# Patient Record
Sex: Female | Born: 1975 | Race: White | Hispanic: No | Marital: Single | Smoking: Never smoker
Health system: Southern US, Community
[De-identification: ages and names within clinical notes are randomized; demographics above are authoritative.]

## PROBLEM LIST (undated history)

## (undated) DIAGNOSIS — M170 Bilateral primary osteoarthritis of knee: Secondary | ICD-10-CM

## (undated) DIAGNOSIS — R609 Edema, unspecified: Secondary | ICD-10-CM

## (undated) HISTORY — DX: Bilateral primary osteoarthritis of knee: M17.0

## (undated) HISTORY — DX: Edema, unspecified: R60.9

## (undated) HISTORY — DX: Morbid (severe) obesity due to excess calories: E66.01

---

## 2004-07-04 ENCOUNTER — Ambulatory Visit: Payer: Self-pay | Admitting: Internal Medicine

## 2004-07-12 ENCOUNTER — Ambulatory Visit: Payer: Self-pay | Admitting: Internal Medicine

## 2004-08-19 ENCOUNTER — Emergency Department: Payer: Self-pay | Admitting: Emergency Medicine

## 2005-05-29 ENCOUNTER — Emergency Department: Payer: Self-pay | Admitting: Emergency Medicine

## 2005-11-28 ENCOUNTER — Ambulatory Visit: Payer: Self-pay | Admitting: Obstetrics & Gynecology

## 2006-04-24 ENCOUNTER — Ambulatory Visit: Payer: Self-pay | Admitting: Family Medicine

## 2006-04-24 ENCOUNTER — Ambulatory Visit: Payer: Self-pay | Admitting: Obstetrics & Gynecology

## 2006-04-24 ENCOUNTER — Encounter: Payer: Self-pay | Admitting: Obstetrics & Gynecology

## 2006-10-27 ENCOUNTER — Ambulatory Visit: Payer: Self-pay | Admitting: Gynecology

## 2007-08-19 ENCOUNTER — Ambulatory Visit: Payer: Self-pay | Admitting: Gynecology

## 2007-10-27 ENCOUNTER — Ambulatory Visit: Payer: Self-pay | Admitting: Obstetrics and Gynecology

## 2007-11-10 ENCOUNTER — Ambulatory Visit (HOSPITAL_COMMUNITY): Admission: RE | Admit: 2007-11-10 | Discharge: 2007-11-10 | Payer: Self-pay | Admitting: Obstetrics & Gynecology

## 2008-06-17 ENCOUNTER — Ambulatory Visit: Payer: Self-pay | Admitting: Obstetrics & Gynecology

## 2008-06-20 ENCOUNTER — Inpatient Hospital Stay: Payer: Self-pay

## 2008-06-25 ENCOUNTER — Emergency Department: Payer: Self-pay | Admitting: Emergency Medicine

## 2010-08-14 NOTE — Assessment & Plan Note (Signed)
NAMEDALANEY, NEEDLE NO.:  [id number]   MEDICAL RECORD NO.:  [id number]          PATIENT TYPE:  POB   LOCATION:  [facility] at [facility]         FACILITY:  [facility]   PHYSICIAN:  Ginger Carne, MD DATE OF BIRTH:  1975/05/23   DATE OF SERVICE:  08/19/2007                                  CLINIC NOTE   HISTORY OF PRESENT ILLNESS:  Ms. Zehner returns today because of  noticing her menses are lasting about one week instead of the usual 3-4  days since placement of her Mirena intrauterine device in July 2008.  She desires to have this removed, and the IUD was removed today without  difficulty.  We talked about other options of birth control, but does  not want to use the pill due to weight gain.  She has complained of some  pelvic pain in the last 1-2 days.  This is midcycle for the patient and  pelvic exam demonstrates uterus to be normal size without tenderness  above the adnexa without tenderness or masses.  I explained to the  patient this may well be due to ovulatory discomfort, but if the pain  does not improve by next week, will proceed with an ultrasound, further  evaluation.  She has no GU symptomatology.           ______________________________  Ginger Carne, MD     SHB/MEDQ  D:  08/19/2007  T:  08/19/2007  Job:  [id number]

## 2010-08-14 NOTE — Assessment & Plan Note (Signed)
[name], Mason NO.:  [id number]   MEDICAL RECORD NO.:  [id number]          PATIENT TYPE:  POB   LOCATION:  [facility] at [facility]         FACILITY:  [facility]   PHYSICIAN:  Elsie Lincoln, MD      DATE OF BIRTH:  [birth year]   DATE OF SERVICE:                                  CLINIC NOTE   HISTORY OF PRESENT ILLNESS:  The patient is a 35 year old G29, P17-0-2-2  female who presents for her yearly exam.  She is an outpatient of Dr.  Mia Creek.  The patient is not really sexually active.  Her last  boyfriend was 6 months.  She is __________  contraception and does not  like anything else at this time due to her infrequency of sexual  intercourse.  She has regular periods and does not have problems with  them.   PAST MEDICAL HISTORY:  Denies all problems.   PAST SURGICAL HISTORY:  Denies all surgeries.   PAST GYN HISTORY:  Last Pap smear in 2006.  She has had a LEEP in the  past.  She has never had a mammogram or a breast biopsy.  Never had any  ovarian cysts, fibroid tumors or sexually transmitted disease.   OB HISTORY:  Two vaginal deliveries.  2 miscarriages.   FAMILY HISTORY:  Grandfather had a heart attack.  Dad has high blood  pressure.  Grandmother had breast cancer in her early 70s.   REVIEW OF SYSTEMS:  She bruises her legs sometimes being clumsy at work.  Very rare numbness and weakness in the fingers.  She trips a lot at work  and her legs swell from this.  She has muscle aches in her neck and  back.  Just recently has fatigue but her TSH has been normal for many  years.  She has gained 10 lb.  She has frequent sinus headaches.  She  has dry eyes and astigmatism.  She has mild chest pain occasionally.  She loses urine when she coughs or sneezes.   PHYSICAL EXAMINATION:  VITAL SIGNS:  Pulse 68, blood pressure 130/79,  weight 196.  GENERAL:  Well developed, well nourished, no acute distress.  THYROID:  No masses.  LUNGS:  Clear to auscultation  bilaterally.  HEART:  Regular rate and rhythm.  HEENT:  Normocephalic, atraumatic.  ABDOMEN:  Soft, nontender, no organomegaly, no hernia.  GENITALIA:  Tanner 5.  Vagina pink.  Normal rugae.  Cervix closed,  nontender.  Adnexa difficult to palpate but nontender.  Perineum intact.  Urethra nontender.  No cystocele, no rectocele.   ASSESSMENT/PLAN:  39. A 35 year old female __________  exam room 1.  Pap smear and      culture.  2. Addressing chest pain with primary care Sissy Goetzke.  3. She would like different birth control.  Please come see [place].  4. Return to clinic in a year.  5. Baseline mammogram at 33 and secondary to grandmother's history of      breast cancer.  6. Cholesterol panel.           ______________________________  Elsie Lincoln, MD     KL/MEDQ  D:  04/24/2006  T:  04/24/2006  Job:  [id number]

## 2011-05-20 ENCOUNTER — Inpatient Hospital Stay: Payer: Self-pay | Admitting: Student

## 2011-05-20 LAB — URINALYSIS, COMPLETE
Glucose,UR: NEGATIVE mg/dL (ref 0–75)
Protein: 30
Specific Gravity: 1.027 (ref 1.003–1.030)
WBC UR: 8 /HPF (ref 0–5)

## 2011-05-20 LAB — COMPREHENSIVE METABOLIC PANEL
Anion Gap: 13 (ref 7–16)
Calcium, Total: 8.5 mg/dL (ref 8.5–10.1)
Chloride: 105 mmol/L (ref 98–107)
EGFR (African American): >60
EGFR (Non-African Amer.): >60
Osmolality: 282 (ref 275–301)
Potassium: 3.4 mmol/L — ABNORMAL LOW (ref 3.5–5.1)
SGOT(AST): >2000 U/L — ABNORMAL HIGH (ref 15–37)

## 2011-05-20 LAB — CBC
HCT: 45.1 % (ref 35.0–47.0)
HGB: 15.2 g/dL (ref 12.0–16.0)
Platelet: 238 10*3/uL (ref 150–440)
WBC: 8 10*3/uL (ref 3.6–11.0)

## 2011-05-20 LAB — PROTIME-INR
INR: 1.5
Prothrombin Time: 18.6 secs — ABNORMAL HIGH (ref 11.5–14.7)

## 2011-05-20 LAB — LIPASE, BLOOD: Lipase: 312 U/L (ref 73–393)

## 2011-05-20 LAB — ACETAMINOPHEN LEVEL: Acetaminophen: <2 ug/mL

## 2011-05-20 LAB — HCG, QUANTITATIVE, PREGNANCY: Beta Hcg, Quant.: <1 m[IU]/mL — ABNORMAL LOW

## 2011-05-21 LAB — COMPREHENSIVE METABOLIC PANEL
Albumin: 2.7 g/dL — ABNORMAL LOW (ref 3.4–5.0)
Anion Gap: 7 (ref 7–16)
BUN: 7 mg/dL (ref 7–18)
Calcium, Total: 7.8 mg/dL — ABNORMAL LOW (ref 8.5–10.1)
Chloride: 110 mmol/L — ABNORMAL HIGH (ref 98–107)
Co2: 24 mmol/L (ref 21–32)
EGFR (African American): >60
EGFR (Non-African Amer.): >60
Osmolality: 278 (ref 275–301)
Potassium: 3.5 mmol/L (ref 3.5–5.1)
SGOT(AST): 1527 U/L — ABNORMAL HIGH (ref 15–37)
Sodium: 141 mmol/L (ref 136–145)

## 2011-05-21 LAB — CBC WITH DIFFERENTIAL/PLATELET
Basophil %: 1.2 %
Eosinophil %: 4.3 %
HGB: 13.1 g/dL (ref 12.0–16.0)
Lymphocyte %: 33.7 %
Monocyte %: 11.1 %
Neutrophil %: 49.7 %
RBC: 4.1 10*6/uL (ref 3.80–5.20)

## 2011-05-21 LAB — MAGNESIUM: Magnesium: 1.6 mg/dL — ABNORMAL LOW

## 2011-05-21 LAB — IRON AND TIBC: Iron Bind.Cap.(Total): 368 ug/dL (ref 250–450)

## 2011-05-22 LAB — PROT IMMUNOELECTROPHORES(ARMC)

## 2011-11-19 ENCOUNTER — Ambulatory Visit: Payer: Self-pay | Admitting: Endocrinology

## 2011-11-21 ENCOUNTER — Ambulatory Visit: Payer: Self-pay | Admitting: Endocrinology

## 2012-10-05 ENCOUNTER — Encounter: Payer: Self-pay | Admitting: Family Medicine

## 2012-10-05 DIAGNOSIS — Z01419 Encounter for gynecological examination (general) (routine) without abnormal findings: Secondary | ICD-10-CM

## 2012-12-14 ENCOUNTER — Encounter: Payer: Self-pay | Admitting: Obstetrics & Gynecology

## 2012-12-14 ENCOUNTER — Ambulatory Visit (INDEPENDENT_AMBULATORY_CARE_PROVIDER_SITE_OTHER): Payer: No Typology Code available for payment source | Admitting: Obstetrics & Gynecology

## 2012-12-14 VITALS — BP 112/79 | HR 74 | Ht 64.0 in | Wt 245.0 lb

## 2012-12-14 DIAGNOSIS — Z124 Encounter for screening for malignant neoplasm of cervix: Secondary | ICD-10-CM

## 2012-12-14 DIAGNOSIS — Z0000 Encounter for general adult medical examination without abnormal findings: Secondary | ICD-10-CM

## 2012-12-14 DIAGNOSIS — Z1151 Encounter for screening for human papillomavirus (HPV): Secondary | ICD-10-CM

## 2012-12-14 DIAGNOSIS — Z01419 Encounter for gynecological examination (general) (routine) without abnormal findings: Secondary | ICD-10-CM

## 2012-12-14 NOTE — Progress Notes (Signed)
Subjective:    Joy Mason is a 37 y.o. SW P3 (16, 50, 37 yo)  female who presents for an annual exam. The patient has no complaints today.  The patient is not currently sexually active. GYN screening history: last pap: was normal. The patient wears seatbelts: yes. The patient participates in regular exercise: no. Has the patient ever been transfused or tattooed?: yes. (tattoos) The patient reports that there is not domestic violence in her life.   Menstrual History: OB History   Grav Para Term Preterm Abortions TAB SAB Ect Mult Living   3 3 3  0 0 0 0 0 0 1      Menarche age: 80 [place]: 75  Patient's last menstrual period was 11/30/2012.    The following portions of the patient's history were reviewed and updated as appropriate: allergies, current medications, past family history, past medical history, past social history, past surgical history and problem list.  Review of Systems A comprehensive review of systems was negative.  She has been abstinent for 6 months, used condoms in the past. 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 Declines flu vaccine. Her FP is Dr. Redge Gainer. She had a mammogram about 1 year ago. She needs another one. Her periods are 4-5 days per month, getting heavier.   Objective:    BP 112/79  Pulse 74  Ht 5\' 4"  (1.626 m)  Wt 245 lb (111.131 kg)  BMI 42.03 kg/m2  LMP 11/30/2012  General Appearance:    Alert, cooperative, no distress, appears stated age  Head:    Normocephalic, without obvious abnormality, atraumatic  Eyes:    PERRL, conjunctiva/corneas clear, EOM's intact, fundi    benign, both eyes  Ears:    Normal TM's and external ear canals, both ears  Nose:   Nares normal, septum midline, mucosa normal, no drainage    or sinus tenderness  Throat:   Lips, mucosa, and tongue normal; teeth and gums normal  Neck:   Supple, symmetrical, trachea midline, no adenopathy;    thyroid:  no enlargement/tenderness/nodules; no carotid   bruit or JVD  Back:      Symmetric, no curvature, ROM normal, no CVA tenderness  Lungs:     Clear to auscultation bilaterally, respirations unlabored  Chest Wall:    No tenderness or deformity   Heart:    Regular rate and rhythm, S1 and S2 normal, no murmur, rub   or gallop  Breast Exam:    No tenderness, masses, or nipple abnormality  Abdomen:     Soft, non-tender, bowel sounds active all four quadrants,    no masses, no organomegaly  Genitalia:    Normal female without lesion, discharge or tenderness, NSSA, NT, mobile, normal adnexal exam     Extremities:   Extremities normal, atraumatic, no cyanosis or edema  Pulses:   2+ and symmetric all extremities  Skin:   Skin color, texture, turgor normal, no rashes or lesions  Lymph nodes:   Cervical, supraclavicular, and axillary nodes normal  Neurologic:   CNII-XII intact, normal strength, sensation and reflexes    throughout  .    Assessment:    Healthy female exam.    Plan:     Breast self exam technique reviewed and patient encouraged to perform self-exam monthly. Thin prep Pap smear. with cotesting Encouraged weight loss She declines a flu vaccine

## 2012-12-14 NOTE — Patient Instructions (Signed)

## 2013-02-09 ENCOUNTER — Ambulatory Visit: Payer: Self-pay | Admitting: Obstetrics & Gynecology

## 2014-01-31 ENCOUNTER — Encounter: Payer: Self-pay | Admitting: Obstetrics & Gynecology

## 2014-06-03 ENCOUNTER — Encounter: Payer: Self-pay | Admitting: Obstetrics & Gynecology

## 2014-06-03 ENCOUNTER — Ambulatory Visit (INDEPENDENT_AMBULATORY_CARE_PROVIDER_SITE_OTHER): Payer: No Typology Code available for payment source | Admitting: Obstetrics & Gynecology

## 2014-06-03 VITALS — BP 111/83 | HR 66 | Ht 64.0 in | Wt 238.4 lb

## 2014-06-03 DIAGNOSIS — Z01419 Encounter for gynecological examination (general) (routine) without abnormal findings: Secondary | ICD-10-CM

## 2014-06-03 DIAGNOSIS — Z803 Family history of malignant neoplasm of breast: Secondary | ICD-10-CM | POA: Insufficient documentation

## 2014-06-03 DIAGNOSIS — Z1231 Encounter for screening mammogram for malignant neoplasm of breast: Secondary | ICD-10-CM

## 2014-06-03 DIAGNOSIS — Z1151 Encounter for screening for human papillomavirus (HPV): Secondary | ICD-10-CM

## 2014-06-03 DIAGNOSIS — Z124 Encounter for screening for malignant neoplasm of cervix: Secondary | ICD-10-CM

## 2014-06-03 NOTE — Progress Notes (Signed)
Patient would like to have a mammogram ordered.  She also wanted to ask if you prescribed phentermine, she walks every day and has changed her eating habits, she lost 20 pounds and hit a plateau.  Last pap was September 2014 and was normal with negative HPV.

## 2014-06-03 NOTE — Patient Instructions (Addendum)
RE: MyChart  Dear Ms. Durney  We are excited to introduce MyChart, a new best-in-class service that provides you online access to important information in your electronic medical record. We want to make it easier for you to view your health information - all in one secure location - when and where you need it. We expect MyChart will enhance the quality of care and service we provide. Use the activation code below to enroll in MyChart online at [email or web address]  When you register for MyChart, you can:  Marland Kitchen View your test results. . Communicate securely with your physician's office.  . View your medical history, allergies, medications, and immunizations. . Conveniently print information such as your medication lists.  If you are age 12 or older and want a member of your family to have access to your record, you must provide written consent by completing a proxy form available at our facility. Please speak to our clinical staff about guidelines regarding accounts for patients younger than age 42.  As you activate your MyChart account and need any technical assistance, please call the MyChart technical support line at [phone]) or email your question to [email or web address]. If you email your question(s), please include your name, a return phone number and the best time to reach you.  Thank you for using MyChart as your new health and wellness resource!  MyChart Activation Code:  [email or web address] Expires: 08/02/2014 10:21 AM       BRCA-1 and BRCA-2 BRCA-1 and BRCA-2 are 2 genes that are linked with hereditary breast and ovarian cancers. About 200,000 women are diagnosed with invasive breast cancer each year and about 23,000 with ovarian cancer (according to the [place]). Of these cancers, about 5% to 10% will be due to a mutation in one of the BRCA genes. Men can also inherit an increased risk of developing breast cancer, primarily from an  alteration in the BRCA-2 gene.  Individuals with mutations in BRCA1 or BRCA2 have significantly elevated risks for breast cancer (up to 80% lifetime risk), ovarian cancer (up to 40% lifetime risk), bilateral breast cancer and other types of cancers. BRCA mutations are inherited and passed from generation to generation. One half of the time, they are passed from the father's side of the family.  The DNA in white blood cells is used to detect mutations in the BRCA genes. While the gene products (proteins) of the BRCA genes act only in breast and ovarian tissue, the genes are present in every cell of the body and blood is the most easily accessible source of that DNA. PREPARATION FOR TEST The test for BRCA mutations is done on a blood sample collected by needle from a vein in the arm. The test does not require surgical biopsy of breast or ovarian tissue.  NORMAL FINDINGS No genetic mutations. Ranges for normal findings may vary among different laboratories and hospitals. You should always check with your doctor after having lab work or other tests done to discuss the meaning of your test results and whether your values are considered within normal limits. MEANING OF TEST  Your caregiver will go over the test results with you and discuss the importance and meaning of your results, as well as treatment options and the need for additional tests if necessary. OBTAINING THE TEST RESULTS It is your responsibility to obtain your test results. Ask the lab or department performing the test when and how you will get your results. OTHER THINGS TO KNOW Your test results may  have implications for other family members. When one member of a family is tested for BRCA mutations, issues often arise about how or whether to share this information with other family members. Seek advice from a genetic counselor about communication of result with your family members.  Pre and post test consultation with a health care provider  knowledgeable about genetic testing cannot be overemphasized.  There are many issues to be considered when preparing for a genetic test and upon learning the results, and a genetic counselor has the knowledge and experience to help you sort through them.  If the BRCA test is positive, the options include increased frequency of check-ups (e.g., mammography, blood tests for CA-125, or transvaginal ultrasonography); medications that could reduce risk (e.g., oral contraceptives or tamoxifen); or surgical removal of the ovaries or breasts. There are a number of variables involved and it is important to discuss your options with your doctor and genetic counselor. Research studies have reported that for every 1000 women negative for BRCA mutations, between 12 and 51 of them will develop breast cancer by age 78 and between 40 and 61 will develop ovarian cancer by age 56. The risk increases with age. The test can be ordered by a doctor, preferably by one who can also offer genetic counseling. The blood sample will be sent to a laboratory that specializes in BRCA testing. The American Society of Clinical Oncology and the [place] encourage women seeking the test to participate in long-term outcome studies to help gather information on the effectiveness of different check-up and treatment options. Document Released: 04/11/2004 Document Revised: 06/10/2011 Document Reviewed: 06/18/2013 [facility] Patient Information 2015 [place], [place]. This information is not intended to replace advice given to you by your health care provider. Make sure you discuss any questions you have with your health care provider.   Preventive Care for Adults A healthy lifestyle and preventive care can promote health and wellness. Preventive health guidelines for women include the following key practices.  A routine yearly physical is a good way to check with your health care provider about your health and preventive  screening. It is a chance to share any concerns and updates on your health and to receive a thorough exam.  Visit your dentist for a routine exam and preventive care every 6 months. Brush your teeth twice a day and floss once a day. Good oral hygiene prevents tooth decay and gum disease.  The frequency of eye exams is based on your age, health, family medical history, use of contact lenses, and other factors. Follow your health care provider's recommendations for frequency of eye exams.  Eat a healthy diet. Foods like vegetables, fruits, whole grains, low-fat dairy products, and lean protein foods contain the nutrients you need without too many calories. Decrease your intake of foods high in solid fats, added sugars, and salt. Eat the right amount of calories for you.Get information about a proper diet from your health care provider, if necessary.  Regular physical exercise is one of the most important things you can do for your health. Most adults should get at least 150 minutes of moderate-intensity exercise (any activity that increases your heart rate and causes you to sweat) each week. In addition, most adults need muscle-strengthening exercises on 2 or more days a week.  Maintain a healthy weight. The body mass index (BMI) is a screening tool to identify possible weight problems. It provides an estimate of body fat based on height and weight. Your health  care provider can find your BMI and can help you achieve or maintain a healthy weight.For adults 20 years and older:  A BMI below 18.5 is considered underweight.  A BMI of 18.5 to 24.9 is normal.  A BMI of 25 to 29.9 is considered overweight.  A BMI of 30 and above is considered obese.  Maintain normal blood lipids and cholesterol levels by exercising and minimizing your intake of saturated fat. Eat a balanced diet with plenty of fruit and vegetables. Blood tests for lipids and cholesterol should begin at age 40 and be repeated every 5  years. If your lipid or cholesterol levels are high, you are over 50, or you are at high risk for heart disease, you may need your cholesterol levels checked more frequently.Ongoing high lipid and cholesterol levels should be treated with medicines if diet and exercise are not working.  If you smoke, find out from your health care provider how to quit. If you do not use tobacco, do not start.  Lung cancer screening is recommended for adults aged 74-80 years who are at high risk for developing lung cancer because of a history of smoking. A yearly low-dose CT scan of the lungs is recommended for people who have at least a 30-pack-year history of smoking and are a current smoker or have quit within the past 15 years. A pack year of smoking is smoking an average of 1 pack of cigarettes a day for 1 year (for example: 1 pack a day for 30 years or 2 packs a day for 15 years). Yearly screening should continue until the smoker has stopped smoking for at least 15 years. Yearly screening should be stopped for people who develop a health problem that would prevent them from having lung cancer treatment.  If you are pregnant, do not drink alcohol. If you are breastfeeding, be very cautious about drinking alcohol. If you are not pregnant and choose to drink alcohol, do not have more than 1 drink per day. One drink is considered to be 12 ounces (355 mL) of beer, 5 ounces (148 mL) of wine, or 1.5 ounces (44 mL) of liquor.  Avoid use of street drugs. Do not share needles with anyone. Ask for help if you need support or instructions about stopping the use of drugs.  High blood pressure causes heart disease and increases the risk of stroke. Your blood pressure should be checked at least every 1 to 2 years. Ongoing high blood pressure should be treated with medicines if weight loss and exercise do not work.  If you are 36-27 years old, ask your health care provider if you should take aspirin to prevent strokes.  Diabetes  screening involves taking a blood sample to check your fasting blood sugar level. This should be done once every 3 years, after age 66, if you are within normal weight and without risk factors for diabetes. Testing should be considered at a younger age or be carried out more frequently if you are overweight and have at least 1 risk factor for diabetes.  Breast cancer screening is essential preventive care for women. You should practice "breast self-awareness." This means understanding the normal appearance and feel of your breasts and may include breast self-examination. Any changes detected, no matter how small, should be reported to a health care provider. Women in their 33s and 30s should have a clinical breast exam (CBE) by a health care provider as part of a regular health exam every 1 to 3 years.  After age 48, women should have a CBE every year. Starting at age 71, women should consider having a mammogram (breast X-ray test) every year. Women who have a family history of breast cancer should talk to their health care provider about genetic screening. Women at a high risk of breast cancer should talk to their health care providers about having an MRI and a mammogram every year.  Breast cancer gene (BRCA)-related cancer risk assessment is recommended for women who have family members with BRCA-related cancers. BRCA-related cancers include breast, ovarian, tubal, and peritoneal cancers. Having family members with these cancers may be associated with an increased risk for harmful changes (mutations) in the breast cancer genes BRCA1 and BRCA2. Results of the assessment will determine the need for genetic counseling and BRCA1 and BRCA2 testing.  Routine pelvic exams to screen for cancer are no longer recommended for nonpregnant women who are considered low risk for cancer of the pelvic organs (ovaries, uterus, and vagina) and who do not have symptoms. Ask your health care provider if a screening pelvic exam is  right for you.  If you have had past treatment for cervical cancer or a condition that could lead to cancer, you need Pap tests and screening for cancer for at least 20 years after your treatment. If Pap tests have been discontinued, your risk factors (such as having a new sexual partner) need to be reassessed to determine if screening should be resumed. Some women have medical problems that increase the chance of getting cervical cancer. In these cases, your health care provider may recommend more frequent screening and Pap tests.  The HPV test is an additional test that may be used for cervical cancer screening. The HPV test looks for the virus that can cause the cell changes on the cervix. The cells collected during the Pap test can be tested for HPV. The HPV test could be used to screen women aged 18 years and older, and should be used in women of any age who have unclear Pap test results. After the age of 58, women should have HPV testing at the same frequency as a Pap test.  Colorectal cancer can be detected and often prevented. Most routine colorectal cancer screening begins at the age of 42 years and continues through age 65 years. However, your health care provider may recommend screening at an earlier age if you have risk factors for colon cancer. On a yearly basis, your health care provider may provide home test kits to check for hidden blood in the stool. Use of a small camera at the end of a tube, to directly examine the colon (sigmoidoscopy or colonoscopy), can detect the earliest forms of colorectal cancer. Talk to your health care provider about this at age 49, when routine screening begins. Direct exam of the colon should be repeated every 5-10 years through age 103 years, unless early forms of pre-cancerous polyps or small growths are found.  People who are at an increased risk for hepatitis B should be screened for this virus. You are considered at high risk for hepatitis B if:  You were  born in a country where hepatitis B occurs often. Talk with your health care provider about which countries are considered high risk.  Your parents were born in a high-risk country and you have not received a shot to protect against hepatitis B (hepatitis B vaccine).  You have HIV or AIDS.  You use needles to inject street drugs.  You live with, or  have sex with, someone who has hepatitis B.  You get hemodialysis treatment.  You take certain medicines for conditions like cancer, organ transplantation, and autoimmune conditions.  Hepatitis C blood testing is recommended for all people born from [birth year] through 1967 and any individual with known risks for hepatitis C.  Practice safe sex. Use condoms and avoid high-risk sexual practices to reduce the spread of sexually transmitted infections (STIs). STIs include gonorrhea, chlamydia, syphilis, trichomonas, herpes, HPV, and human immunodeficiency virus (HIV). Herpes, HIV, and HPV are viral illnesses that have no cure. They can result in disability, cancer, and death.  You should be screened for sexually transmitted illnesses (STIs) including gonorrhea and chlamydia if:  You are sexually active and are younger than 24 years.  You are older than 24 years and your health care provider tells you that you are at risk for this type of infection.  Your sexual activity has changed since you were last screened and you are at an increased risk for chlamydia or gonorrhea. Ask your health care provider if you are at risk.  If you are at risk of being infected with HIV, it is recommended that you take a prescription medicine daily to prevent HIV infection. This is called preexposure prophylaxis (PrEP). You are considered at risk if:  You are a heterosexual woman, are sexually active, and are at increased risk for HIV infection.  You take drugs by injection.  You are sexually active with a partner who has HIV.  Talk with your health care provider about  whether you are at high risk of being infected with HIV. If you choose to begin PrEP, you should first be tested for HIV. You should then be tested every 3 months for as long as you are taking PrEP.  Osteoporosis is a disease in which the bones lose minerals and strength with aging. This can result in serious bone fractures or breaks. The risk of osteoporosis can be identified using a bone density scan. Women ages 62 years and over and women at risk for fractures or osteoporosis should discuss screening with their health care providers. Ask your health care provider whether you should take a calcium supplement or vitamin D to reduce the rate of osteoporosis.  Menopause can be associated with physical symptoms and risks. Hormone replacement therapy is available to decrease symptoms and risks. You should talk to your health care provider about whether hormone replacement therapy is right for you.  Use sunscreen. Apply sunscreen liberally and repeatedly throughout the day. You should seek shade when your shadow is shorter than you. Protect yourself by wearing long sleeves, pants, a wide-brimmed hat, and sunglasses year round, whenever you are outdoors.  Once a month, do a whole body skin exam, using a mirror to look at the skin on your back. Tell your health care provider of new moles, moles that have irregular borders, moles that are larger than a pencil eraser, or moles that have changed in shape or color.  Stay current with required vaccines (immunizations).  Influenza vaccine. All adults should be immunized every year.  Tetanus, diphtheria, and acellular pertussis (Td, Tdap) vaccine. Pregnant women should receive 1 dose of Tdap vaccine during each pregnancy. The dose should be obtained regardless of the length of time since the last dose. Immunization is preferred during the 27th-36th week of gestation. An adult who has not previously received Tdap or who does not know her vaccine status should  receive 1 dose of Tdap. This initial dose  should be followed by tetanus and diphtheria toxoids (Td) booster doses every 10 years. Adults with an unknown or incomplete history of completing a 3-dose immunization series with Td-containing vaccines should begin or complete a primary immunization series including a Tdap dose. Adults should receive a Td booster every 10 years.  Varicella vaccine. An adult without evidence of immunity to varicella should receive 2 doses or a second dose if she has previously received 1 dose. Pregnant females who do not have evidence of immunity should receive the first dose after pregnancy. This first dose should be obtained before leaving the health care facility. The second dose should be obtained 4-8 weeks after the first dose.  Human papillomavirus (HPV) vaccine. Females aged 13-26 years who have not received the vaccine previously should obtain the 3-dose series. The vaccine is not recommended for use in pregnant females. However, pregnancy testing is not needed before receiving a dose. If a female is found to be pregnant after receiving a dose, no treatment is needed. In that case, the remaining doses should be delayed until after the pregnancy. Immunization is recommended for any person with an immunocompromised condition through the age of 54 years if she did not get any or all doses earlier. During the 3-dose series, the second dose should be obtained 4-8 weeks after the first dose. The third dose should be obtained 24 weeks after the first dose and 16 weeks after the second dose.  Zoster vaccine. One dose is recommended for adults aged 20 years or older unless certain conditions are present.  Measles, mumps, and rubella (MMR) vaccine. Adults born before [birth year] generally are considered immune to measles and mumps. Adults born in [birth year] or later should have 1 or more doses of MMR vaccine unless there is a contraindication to the vaccine or there is laboratory evidence of  immunity to each of the three diseases. A routine second dose of MMR vaccine should be obtained at least 28 days after the first dose for students attending postsecondary schools, health care workers, or international travelers. People who received inactivated measles vaccine or an unknown type of measles vaccine during 1963-1967 should receive 2 doses of MMR vaccine. People who received inactivated mumps vaccine or an unknown type of mumps vaccine before 1979 and are at high risk for mumps infection should consider immunization with 2 doses of MMR vaccine. For females of childbearing age, rubella immunity should be determined. If there is no evidence of immunity, females who are not pregnant should be vaccinated. If there is no evidence of immunity, females who are pregnant should delay immunization until after pregnancy. Unvaccinated health care workers born before [birth year] who lack laboratory evidence of measles, mumps, or rubella immunity or laboratory confirmation of disease should consider measles and mumps immunization with 2 doses of MMR vaccine or rubella immunization with 1 dose of MMR vaccine.  Pneumococcal 13-valent conjugate (PCV13) vaccine. When indicated, a person who is uncertain of her immunization history and has no record of immunization should receive the PCV13 vaccine. An adult aged 36 years or older who has certain medical conditions and has not been previously immunized should receive 1 dose of PCV13 vaccine. This PCV13 should be followed with a dose of pneumococcal polysaccharide (PPSV23) vaccine. The PPSV23 vaccine dose should be obtained at least 8 weeks after the dose of PCV13 vaccine. An adult aged 14 years or older who has certain medical conditions and previously received 1 or more doses of PPSV23 vaccine should receive 1 dose  of PCV13. The PCV13 vaccine dose should be obtained 1 or more years after the last PPSV23 vaccine dose.  Pneumococcal polysaccharide (PPSV23) vaccine. When PCV13  is also indicated, PCV13 should be obtained first. All adults aged 10 years and older should be immunized. An adult younger than age 60 years who has certain medical conditions should be immunized. Any person who resides in a nursing home or long-term care facility should be immunized. An adult smoker should be immunized. People with an immunocompromised condition and certain other conditions should receive both PCV13 and PPSV23 vaccines. People with human immunodeficiency virus (HIV) infection should be immunized as soon as possible after diagnosis. Immunization during chemotherapy or radiation therapy should be avoided. Routine use of PPSV23 vaccine is not recommended for American Indians, [place] Natives, or people younger than 65 years unless there are medical conditions that require PPSV23 vaccine. When indicated, people who have unknown immunization and have no record of immunization should receive PPSV23 vaccine. One-time revaccination 5 years after the first dose of PPSV23 is recommended for people aged 19-64 years who have chronic kidney failure, nephrotic syndrome, asplenia, or immunocompromised conditions. People who received 1-2 doses of PPSV23 before age 62 years should receive another dose of PPSV23 vaccine at age 45 years or later if at least 5 years have passed since the previous dose. Doses of PPSV23 are not needed for people immunized with PPSV23 at or after age 50 years.  Meningococcal vaccine. Adults with asplenia or persistent complement component deficiencies should receive 2 doses of quadrivalent meningococcal conjugate (MenACWY-D) vaccine. The doses should be obtained at least 2 months apart. Microbiologists working with certain meningococcal bacteria, [employer] recruits, people at risk during an outbreak, and people who travel to or live in countries with a high rate of meningitis should be immunized. A first-year college student up through age 68 years who is living in a residence hall  should receive a dose if she did not receive a dose on or after her 16th birthday. Adults who have certain high-risk conditions should receive one or more doses of vaccine.  Hepatitis A vaccine. Adults who wish to be protected from this disease, have certain high-risk conditions, work with hepatitis A-infected animals, work in hepatitis A research labs, or travel to or work in countries with a high rate of hepatitis A should be immunized. Adults who were previously unvaccinated and who anticipate close contact with an international adoptee during the first 60 days after arrival in the [place] from a country with a high rate of hepatitis A should be immunized.  Hepatitis B vaccine. Adults who wish to be protected from this disease, have certain high-risk conditions, may be exposed to blood or other infectious body fluids, are household contacts or sex partners of hepatitis B positive people, are clients or workers in certain care facilities, or travel to or work in countries with a high rate of hepatitis B should be immunized.  Haemophilus influenzae type b (Hib) vaccine. A previously unvaccinated person with asplenia or sickle cell disease or having a scheduled splenectomy should receive 1 dose of Hib vaccine. Regardless of previous immunization, a recipient of a hematopoietic stem cell transplant should receive a 3-dose series 6-12 months after her successful transplant. Hib vaccine is not recommended for adults with HIV infection. Preventive Services / Frequency Ages 64 to 65 years  Blood pressure check.** / Every 1 to 2 years.  Lipid and cholesterol check.** / Every 5 years beginning at age 6.  Clinical  breast exam.** / Every 3 years for women in their 66s and 19s.  BRCA-related cancer risk assessment.** / For women who have family members with a BRCA-related cancer (breast, ovarian, tubal, or peritoneal cancers).  Pap test.** / Every 2 years from ages 90 through 26. Every 3 years  starting at age 54 through age 39 or 76 with a history of 3 consecutive normal Pap tests.  HPV screening.** / Every 3 years from ages 86 through ages 61 to 69 with a history of 3 consecutive normal Pap tests.  Hepatitis C blood test.** / For any individual with known risks for hepatitis C.  Skin self-exam. / Monthly.  Influenza vaccine. / Every year.  Tetanus, diphtheria, and acellular pertussis (Tdap, Td) vaccine.** / Consult your health care provider. Pregnant women should receive 1 dose of Tdap vaccine during each pregnancy. 1 dose of Td every 10 years.  Varicella vaccine.** / Consult your health care provider. Pregnant females who do not have evidence of immunity should receive the first dose after pregnancy.  HPV vaccine. / 3 doses over 6 months, if 81 and younger. The vaccine is not recommended for use in pregnant females. However, pregnancy testing is not needed before receiving a dose.  Measles, mumps, rubella (MMR) vaccine.** / You need at least 1 dose of MMR if you were born in 1957 or later. You may also need a 2nd dose. For females of childbearing age, rubella immunity should be determined. If there is no evidence of immunity, females who are not pregnant should be vaccinated. If there is no evidence of immunity, females who are pregnant should delay immunization until after pregnancy.  Pneumococcal 13-valent conjugate (PCV13) vaccine.** / Consult your health care provider.  Pneumococcal polysaccharide (PPSV23) vaccine.** / 1 to 2 doses if you smoke cigarettes or if you have certain conditions.  Meningococcal vaccine.** / 1 dose if you are age 42 to 55 years and a Market researcher living in a residence hall, or have one of several medical conditions, you need to get vaccinated against meningococcal disease. You may also need additional booster doses.  Hepatitis A vaccine.** / Consult your health care provider.  Hepatitis B vaccine.** / Consult your health care  provider.  Haemophilus influenzae type b (Hib) vaccine.** / Consult your health care provider. Ages 73 to 73 years  Blood pressure check.** / Every 1 to 2 years.  Lipid and cholesterol check.** / Every 5 years beginning at age 95 years.  Lung cancer screening. / Every year if you are aged 69-80 years and have a 30-pack-year history of smoking and currently smoke or have quit within the past 15 years. Yearly screening is stopped once you have quit smoking for at least 15 years or develop a health problem that would prevent you from having lung cancer treatment.  Clinical breast exam.** / Every year after age 17 years.  BRCA-related cancer risk assessment.** / For women who have family members with a BRCA-related cancer (breast, ovarian, tubal, or peritoneal cancers).  Mammogram.** / Every year beginning at age 103 years and continuing for as long as you are in good health. Consult with your health care provider.  Pap test.** / Every 3 years starting at age 27 years through age 30 or 50 years with a history of 3 consecutive normal Pap tests.  HPV screening.** / Every 3 years from ages 47 years through ages 36 to 30 years with a history of 3 consecutive normal Pap tests.  Fecal occult blood  test (FOBT) of stool. / Every year beginning at age 61 years and continuing until age 66 years. You may not need to do this test if you get a colonoscopy every 10 years.  Flexible sigmoidoscopy or colonoscopy.** / Every 5 years for a flexible sigmoidoscopy or every 10 years for a colonoscopy beginning at age 13 years and continuing until age 70 years.  Hepatitis C blood test.** / For all people born from [birth year] through 1967 and any individual with known risks for hepatitis C.  Skin self-exam. / Monthly.  Influenza vaccine. / Every year.  Tetanus, diphtheria, and acellular pertussis (Tdap/Td) vaccine.** / Consult your health care provider. Pregnant women should receive 1 dose of Tdap vaccine during each  pregnancy. 1 dose of Td every 10 years.  Varicella vaccine.** / Consult your health care provider. Pregnant females who do not have evidence of immunity should receive the first dose after pregnancy.  Zoster vaccine.** / 1 dose for adults aged 55 years or older.  Measles, mumps, rubella (MMR) vaccine.** / You need at least 1 dose of MMR if you were born in 1957 or later. You may also need a 2nd dose. For females of childbearing age, rubella immunity should be determined. If there is no evidence of immunity, females who are not pregnant should be vaccinated. If there is no evidence of immunity, females who are pregnant should delay immunization until after pregnancy.  Pneumococcal 13-valent conjugate (PCV13) vaccine.** / Consult your health care provider.  Pneumococcal polysaccharide (PPSV23) vaccine.** / 1 to 2 doses if you smoke cigarettes or if you have certain conditions.  Meningococcal vaccine.** / Consult your health care provider.  Hepatitis A vaccine.** / Consult your health care provider.  Hepatitis B vaccine.** / Consult your health care provider.  Haemophilus influenzae type b (Hib) vaccine.** / Consult your health care provider. Ages 27 years and over  Blood pressure check.** / Every 1 to 2 years.  Lipid and cholesterol check.** / Every 5 years beginning at age 27 years.  Lung cancer screening. / Every year if you are aged 28-80 years and have a 30-pack-year history of smoking and currently smoke or have quit within the past 15 years. Yearly screening is stopped once you have quit smoking for at least 15 years or develop a health problem that would prevent you from having lung cancer treatment.  Clinical breast exam.** / Every year after age 53 years.  BRCA-related cancer risk assessment.** / For women who have family members with a BRCA-related cancer (breast, ovarian, tubal, or peritoneal cancers).  Mammogram.** / Every year beginning at age 82 years and continuing for as  long as you are in good health. Consult with your health care provider.  Pap test.** / Every 3 years starting at age 34 years through age 4 or 54 years with 3 consecutive normal Pap tests. Testing can be stopped between 65 and 70 years with 3 consecutive normal Pap tests and no abnormal Pap or HPV tests in the past 10 years.  HPV screening.** / Every 3 years from ages 50 years through ages 22 or 75 years with a history of 3 consecutive normal Pap tests. Testing can be stopped between 65 and 70 years with 3 consecutive normal Pap tests and no abnormal Pap or HPV tests in the past 10 years.  Fecal occult blood test (FOBT) of stool. / Every year beginning at age 9 years and continuing until age 10 years. You may not need to do this test  if you get a colonoscopy every 10 years.  Flexible sigmoidoscopy or colonoscopy.** / Every 5 years for a flexible sigmoidoscopy or every 10 years for a colonoscopy beginning at age 80 years and continuing until age 40 years.  Hepatitis C blood test.** / For all people born from [birth year] through 1967 and any individual with known risks for hepatitis C.  Osteoporosis screening.** / A one-time screening for women ages 36 years and over and women at risk for fractures or osteoporosis.  Skin self-exam. / Monthly.  Influenza vaccine. / Every year.  Tetanus, diphtheria, and acellular pertussis (Tdap/Td) vaccine.** / 1 dose of Td every 10 years.  Varicella vaccine.** / Consult your health care provider.  Zoster vaccine.** / 1 dose for adults aged 32 years or older.  Pneumococcal 13-valent conjugate (PCV13) vaccine.** / Consult your health care provider.  Pneumococcal polysaccharide (PPSV23) vaccine.** / 1 dose for all adults aged 70 years and older.  Meningococcal vaccine.** / Consult your health care provider.  Hepatitis A vaccine.** / Consult your health care provider.  Hepatitis B vaccine.** / Consult your health care provider.  Haemophilus influenzae type b  (Hib) vaccine.** / Consult your health care provider. ** Family history and personal history of risk and conditions may change your health care provider's recommendations. Document Released: 05/14/2001 Document Revised: 08/02/2013 Document Reviewed: 08/13/2010 [facility] Patient Information 2015 [place], [place]. This information is not intended to replace advice given to you by your health care provider. Make sure you discuss any questions you have with your health care provider.

## 2014-06-03 NOTE — Progress Notes (Signed)
    GYNECOLOGY CLINIC ANNUAL PREVENTATIVE CARE ENCOUNTER NOTE  Subjective:     Joy Mason is a 39 y.o. G85P3003 female here for a routine annual gynecologic exam. No GYN complaints.   Gynecologic History Patient's last menstrual period was 05/13/2014 (within days). Contraception: none, not sexually active Last Pap: 11/2012. Results were: normal with negative HPV. Mother undergoing radiation treatment for breast cancer, PGM also had breast cancer. Interested in BRCA testing.  Obstetric History OB History  Gravida Para Term Preterm AB SAB TAB Ectopic Multiple Living  $Remov'3 3 3 'YvSXUq$ 0 0 0 0 0 0 3    # Outcome Date GA Lbr Len/2nd Weight Sex Delivery Anes PTL Lv  3 Term           2 Term           1 Term               History   Social History  . Marital Status: Single    Spouse Name: N/A  . Number of Children: N/A  . Years of Education: N/A   Occupational History  . Not on file.   Social History Main Topics  . Smoking status: Never Smoker   . Smokeless tobacco: Never Used  . Alcohol Use: Yes     Comment: social  . Drug Use: No  . Sexual Activity:    Partners: Male    Birth Control/ Protection: Condom   Other Topics Concern  . Not on file   Social History Narrative   The following portions of the patient's history were reviewed and updated as appropriate: allergies, current medications, past family history, past medical history, past social history, past surgical history and problem list.  Review of Systems A comprehensive review of systems was negative.   Objective:   BP 111/83 mmHg  Pulse 66  Ht $R'5\' 4"'NO$  (1.626 m)  Wt 238 lb 6.4 oz (108.138 kg)  BMI 40.90 kg/m2  LMP 05/13/2014 (Within Days) GENERAL: Well-developed, obese female in no acute distress.  HEENT: Normocephalic, atraumatic. Sclerae anicteric.  NECK: Supple. Normal thyroid.  LUNGS: Clear to auscultation bilaterally.  HEART: Regular rate and rhythm.  BREASTS: Symmetric in size. No masses, skin changes,  nipple drainage, or lymphadenopathy.  ABDOMEN: Soft, obese, nontender, nondistended. No organomegaly palpated. PELVIC: Normal external female genitalia. Vagina is pink and rugated. Normal discharge. Normal cervix contour. Pap smear obtained. Unable to palpate uterus or adnexa secondary to habitus  EXTREMITIES: No cyanosis, clubbing, or edema, 2+ distal pulses.  Assessment:   Annual gynecologic examination FH of breast cancer   Plan:   Pap done, will follow up results and manage accordingly. Mammogram scheduled; will find out if insurance covers BRCA testing. Information given to patient. Routine preventative health maintenance measures emphasized   Verita Schneiders, MD, [place] Attending [place] for [place]

## 2014-06-07 LAB — CYTOLOGY - PAP

## 2014-06-17 ENCOUNTER — Ambulatory Visit: Payer: Self-pay

## 2014-07-24 NOTE — Consult Note (Signed)
Pt awake, alert, oriented, no flap, doesn't do mental math very well, not a valid test for her ( serial 7's) chest clear, heart RRR, abd non tender.  AST and ALT falling, TB also,  PT up a bit, acetominophen level neg.  Amoxicillin most likely cause, no specific therapy.  Continue observe.    Electronic Signatures: Manya Silvas (MD)  (Signed on 19-Feb-13 10:28)  Authored  Last Updated: 19-Feb-13 10:28 by Manya Silvas (MD)

## 2014-07-24 NOTE — H&P (Signed)
PATIENT NAME:  Joy Mason, Joy Mason MR#:  [id number] DATE OF BIRTH:  December 12, 1975  DATE OF ADMISSION:  05/20/2011  REFERRING PHYSICIAN:  Lisa Roca, MD  CONSULTING PHYSICIAN:  Evangelyne Loja S. Manuella Ghazi, MD PRIMARY CARE PHYSICIAN: Willia Craze, MD at [facility] at [place]: Jaundice and elevated liver function tests.   HISTORY OF PRESENT ILLNESS: The patient is a 39 year old healthy female who was seen in consultation for acute elevation of LFTs and jaundice. The patient was sent over from the OB/GYN office after blood work was completed as she has been noticing jaundice and had elevated liver function tests at the doctor's office. While in the ER, she was found to have AST of more than 2000 with ALT of 1903. Alkaline phosphatase was 161. The patient has been recently treated with tinidazole 500 mg b.i.d.  for 14 days, along with amoxicillin 500 mg t.i.d., after failure of Flagyl about a month ago for bacterial vaginosis. Since the treatment of tinidazole, she has been noticing some hot flashes, feeling cold or hot at times, also feeling dizzy and shaky at times. She threw up the last 2 to 3 days and has also been feeling nauseous. She also started noticing changes in her color with yellowish tinge. She is also feeling more sweaty at times. She felt the medications were causing some side effects and found over-the-counter toxic liver flush to see if it helped, which contained vinegar, honey and lemon. She  took it last Friday as her doctor mentioned both of those drugs getting cleared by the liver, but she did not get much improvement. She went to see her OB/GYN today and got referred here to the Emergency Department. While in the ED, she has been crying when I evaluated her and feels very anxious for some bad event to happen. She has also taken B12 and glucosamine in the past regularly for her energy problem and knee pain. She has also tried phentermine for weight loss for about six months and has  taken it off for the last 2 to 3 months. She also indicates using lemon balm, some over-the-counter remedy for stress/anxiety once or twice. She denies any other symptoms at this time.   PAST MEDICAL HISTORY: Knee pain.  ALLERGIES: Sulfa, causing rash.   MEDICATIONS AT HOME: Nothing regularly, but she was taking B12 and glucosamine intermittently. She was also on phentermine which she stopped 2 to 3 months ago. She has also been recently treated with tinidazole and amoxicillin for about two weeks.   FAMILY HISTORY: Grandfather had hepatitis C. Mother with diabetes. Father with hypertension and fibromyalgia.   SOCIAL HISTORY: No smoking. Occasional alcohol. No IV drugs of abuse. 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 She did have a tattooing done about a year ago after her daughter was born.   REVIEW OF SYSTEMS: CONSTITUTIONAL: No fever. Positive fatigue and weakness. EYES: Yellowish tinge. No blurry or double vision. ENT: No tinnitus, ear pain. RESPIRATORY: No cough or hemoptysis. CARDIOVASCULAR: No chest pain, orthopnea, edema. GI: Positive for nausea, vomiting and abdominal uneasiness, also positive for jaundice. GU: No dysuria or hematuria. ENDOCRINE: No polyuria, nocturia, hot and cold feeling and intolerance. HEMATOLOGY: No anemia, easy bruising. SKIN: No rash or lesion. MUSCULOSKELETAL: Positive for knee pain. NEUROLOGIC: No tingling, numbness, or weakness. PSYCHIATRIC: No history of anxiety or depression.   PHYSICAL EXAMINATION:  VITAL SIGNS: Temperature 97.8, heart rate 102 per minute, respirations 18 per minute, blood pressure 129/85 mmHg. She is  saturating 100% on room air.   GENERAL: The patient is a 39 year old female lying in the bed, seems quite comfortable.   HEENT: Eyes: Pupils are equal, round, reactive to light and accommodation.  She does have significant scleral icterus. HENT: Head atraumatic, normocephalic. Oropharynx and nasopharynx clear.   NECK: Supple. No  jugular venous distention. No thyroid enlargement or tenderness.   LUNGS: Clear to auscultation bilaterally. No wheezing, rales, rhonchi, or crepitation.   HEART: S1, S2 normal. No murmurs, rubs or gallop.   ABDOMEN: Soft, nontender, nondistended. Bowel sounds are present. No organomegaly or mass.   EXTREMITIES: No pedal edema, cyanosis, or clubbing.   NEUROLOGICAL: Nonfocal examination. Cranial nerves II through XII are intact. Muscle strength 5 out of 5 in all extremities. Sensation intact.  PSYCHIATRIC: The patient is oriented to time, place, and person x3. She seems anxious.   LABORATORY, DIAGNOSTIC AND RADIOLOGICAL DATA:  Normal BMP except potassium of 3.4, blood glucose 139. Serum  HCG less than 1.0.  Liver function tests showed total bilirubin of 8.8, alkaline phosphatase 161, AST more than 2000, ALT 1903.  Normal CBC.   PT of 18.6, INR 1.5.  Urinalysis showed 2+ bilirubin, 30 protein, 8 WBCs and trace bacteria.  Serum Tylenol less than 2.0.  Abdominal ultrasound showed no gallstone. Thickened gallbladder wall.   IMPRESSION AND PLAN:  1. Suspected acute hepatitis, likely culprit being amoxicillin which can cause acute cytolytic hepatitis along with cholestatic jaundice or hepatic cholestasis. She has also tried tinidazole which is not a common culprit. Considering her young age and overall healthy status, she will need to be monitored under the Hepatic Transplant Unit at a tertiary care center. She has been accepted at [facility] by Dr. Freddrick March and will be transferred directly from the Emergency Department. She was also evaluated by GI, Dr. Verta Ellen, here in the Emergency Department and he also agrees with the same.  2. Nausea, vomiting, and abdominal pain: Likely due to possible acute hepatitis, would provide symptomatic management.  3. Anxiety: Likely due to this new diagnosis. Can treat her with antianxiety medication as needed.   The patient  is at high risk for acute fulminant  hepatic failure and possible death. This was discussed with the patient and the ED physician, along with Dr. Verta Ellen. Everyone, including the patient, is in agreement for tertiary care transfer. I have also discussed the case with the patient's family members at the bedside who are also in agreement.   TIME TAKEN: Total time taking care of this patient is 55 minutes (critical care).  ____________________________ Lucina Mellow. Manuella Ghazi, MD vss:cbb D: 05/20/2011 18:22:43 ET T: 05/20/2011 19:18:23 ET JOB#: [id number] cc: Marcie Bal Dear, MD Dr. Freddrick March at [place] MD ELECTRONICALLY SIGNED 05/21/2011 16:15

## 2014-07-24 NOTE — H&P (Signed)
PATIENT NAME:  Joy Mason, Joy Mason MR#:  [id number] DATE OF BIRTH:  1975-11-17  DATE OF ADMISSION:  05/20/2011  REFERRING PHYSICIAN:   CONSULTING PHYSICIAN:  Sinead Hockman S. Manuella Ghazi, MD  ADDENDUM:  The patient was waiting to get transferred to [facility] but they do not have beds. Accepting physician is okay with she will need to be admitted to inpatient status as ED there does not have any beds at this time. At this time will go ahead and bring her in and order full GI workup including ANA, SPEP, ceruloplasmin, anti-smooth muscle antibody, hepatitis panel, CMV, and EBV panel along with serum ammonia. Also check daily pro-times and liver functions along with fingerstick glucose every eight hours as she is at high risk for getting decompensated acutely. She will be watched very carefully. If she worsens, certainly other tertiary care center may need to be considered if [facility] does not open up with a bed in the near future. This was discussed with Dr. Vira Agar again and he will see the patient if patient is still here in the morning.   ____________________________ Bryceton Hantz S. Manuella Ghazi, MD vss:drc D: 05/20/2011 20:17:24 ET T: 05/21/2011 05:44:14 ET JOB#: [id number]  cc: Samanatha Brammer S. Manuella Ghazi, MD, <Dictator> Lucina Mellow [facility] MD ELECTRONICALLY SIGNED 05/21/2011 16:16

## 2014-07-24 NOTE — Consult Note (Signed)
PATIENT NAME:  Joy Mason, Joy Mason MR#:  [id number] DATE OF BIRTH:  06-21-1975  DATE OF CONSULTATION:  05/20/2011  REFERRING PHYSICIAN:  Lisa Roca, MD  CONSULTING PHYSICIAN:  Greenlee Ancheta S. Manuella Ghazi, MD PRIMARY CARE PHYSICIAN: Willia Craze, MD at [facility] OB/GYN at [place]: Jaundice and elevated liver function tests.   HISTORY OF PRESENT ILLNESS: The patient is a 39 year old healthy female who was seen in consultation for acute elevation of LFTs and jaundice. The patient was sent over from the OB/GYN office after blood work was completed as she has been noticing jaundice and had elevated liver function tests at the doctor's office. While in the ER, she was found to have AST of more than 2000 with ALT of 1903. Alkaline phosphatase was 161. The patient has been recently treated with tinidazole 500 mg b.i.d.  for 14 days, along with amoxicillin 500 mg t.i.d., after failure of Flagyl about a month ago for bacterial vaginosis. Since the treatment of tinidazole, she has been noticing some hot flashes, feeling cold or hot at times, also feeling dizzy and shaky at times. She threw up the last 2 to 3 days and has also been feeling nauseous. She also started noticing changes in her color with yellowish tinge. She is also feeling more sweaty at times. She felt the medications were causing some side effects and found over-the-counter toxic liver flush to see if it helped, which contained vinegar, honey and lemon. She  took it last Friday as her doctor mentioned both of those drugs getting cleared by the liver, but she did not get much improvement. She went to see her OB/GYN today and got referred here to the Emergency Department. While in the ED, she has been crying when I evaluated her and feels very anxious for some bad event to happen. She has also taken B12 and glucosamine in the past regularly for her energy problem and knee pain. She has also tried phentermine for weight loss for about six months and  has taken it off for the last 2 to 3 months. She also indicates using lemon balm, some over-the-counter remedy for stress/anxiety once or twice. She denies any other symptoms at this time.   PAST MEDICAL HISTORY: Knee pain.  ALLERGIES: Sulfa, causing rash.   MEDICATIONS AT HOME: Nothing regularly, but she was taking B12 and glucosamine intermittently. She was also on phentermine which she stopped 2 to 3 months ago. She has also been recently treated with tinidazole and amoxicillin for about two weeks.   FAMILY HISTORY: Grandfather had hepatitis C. Mother with diabetes. Father with hypertension and fibromyalgia.   SOCIAL HISTORY: No smoking. Occasional alcohol. No IV drugs of abuse. 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 She did have a tattooing done about a year ago after her daughter was born.   REVIEW OF SYSTEMS: CONSTITUTIONAL: No fever. Positive fatigue and weakness. EYES: Yellowish tinge. No blurry or double vision. ENT: No tinnitus, ear pain. RESPIRATORY: No cough or hemoptysis. CARDIOVASCULAR: No chest pain, orthopnea, edema. GI: Positive for nausea, vomiting and abdominal uneasiness, also positive for jaundice. GU: No dysuria or hematuria. ENDOCRINE: No polyuria, nocturia, hot and cold feeling and intolerance. HEMATOLOGY: No anemia, easy bruising. SKIN: No rash or lesion. MUSCULOSKELETAL: Positive for knee pain. NEUROLOGIC: No tingling, numbness, or weakness. PSYCHIATRIC: No history of anxiety or depression.   PHYSICAL EXAMINATION:  VITAL SIGNS: Temperature 97.8, heart rate 102 per minute, respirations 18 per minute, blood pressure 129/85 mmHg. She is  saturating 100% on room air.   GENERAL: The patient is a 39 year old female lying in the bed, seems quite comfortable.   HEENT: Eyes: Pupils are equal, round, reactive to light and accommodation.  She does have significant scleral icterus. HENT: Head atraumatic, normocephalic. Oropharynx and nasopharynx clear.   NECK: Supple.  No jugular venous distention. No thyroid enlargement or tenderness.   LUNGS: Clear to auscultation bilaterally. No wheezing, rales, rhonchi, or crepitation.   HEART: S1, S2 normal. No murmurs, rubs or gallop.   ABDOMEN: Soft, nontender, nondistended. Bowel sounds are present. No organomegaly or mass.   EXTREMITIES: No pedal edema, cyanosis, or clubbing.   NEUROLOGICAL: Nonfocal examination. Cranial nerves II through XII are intact. Muscle strength 5 out of 5 in all extremities. Sensation intact.  PSYCHIATRIC: The patient is oriented to time, place, and person x3. She seems anxious.   LABORATORY, DIAGNOSTIC AND RADIOLOGICAL DATA:  Normal BMP except potassium of 3.4, blood glucose 139. Serum  HCG less than 1.0.  Liver function tests showed total bilirubin of 8.8, alkaline phosphatase 161, AST more than 2000, ALT 1903.  Normal CBC.   PT of 18.6, INR 1.5.  Urinalysis showed 2+ bilirubin, 30 protein, 8 WBCs and trace bacteria.  Serum Tylenol less than 2.0.  Abdominal ultrasound showed no gallstone. Thickened gallbladder wall.   IMPRESSION AND PLAN:  1. Suspected acute hepatitis, likely culprit being amoxicillin which can cause acute cytolytic hepatitis along with cholestatic jaundice or hepatic cholestasis. She has also tried tinidazole which is not a common culprit. Considering her young age and overall healthy status, she will need to be monitored under the Hepatic Transplant Unit at a tertiary care center. She has been accepted at [facility] by Dr. Freddrick March and will be transferred directly from the Emergency Department. She was also evaluated by GI, Dr. Verta Ellen, here in the Emergency Department and he also agrees with the same.  2. Nausea, vomiting, and abdominal pain: Likely due to possible acute hepatitis, would provide symptomatic management.  3. Anxiety: Likely due to this new diagnosis. Can treat her with antianxiety medication as needed.   The patient  is at high risk for acute fulminant  hepatic failure and possible death. This was discussed with the patient and the ED physician, along with Dr. Verta Ellen. Everyone, including the patient, is in agreement for tertiary care transfer. I have also discussed the case with the patient's family members at the bedside who are also in agreement.   TIME TAKEN: Total time taking care of this patient is 55 minutes (critical care).  ____________________________ Lucina Mellow. Manuella Ghazi, MD vss:cbb D: 05/20/2011 18:22:43 ET T: 05/20/2011 19:18:23 ET JOB#: [id number] cc: Marcie Bal Dear, MD Dr. Freddrick March at [place] MD ELECTRONICALLY SIGNED 05/21/2011 16:15

## 2014-07-24 NOTE — Consult Note (Signed)
PATIENT NAME:  Joy Mason, Joy Mason MR#:  [id number] DATE OF BIRTH:  Aug 14, 1975  DATE OF CONSULTATION:  05/20/2011  CONSULTING PHYSICIAN:  Manya Silvas, MD  REASON FOR CONSULTATION: The patient is a 39 year old white female who presented to the ER after being at a walk-in center, found to have a very elevated bilirubin. She was evaluated by the ER physician and the hospitalist. I was asked to see her in consultation.   HISTORY OF PRESENT ILLNESS: The patient says that she has been feeling bad for about 3 or 4 days with right upper quadrant pain and darkening of her urine, constipation, and nausea and vomiting. She complained of having nausea and vomiting Friday and then again today. The patient was on metronidazole course of therapy for the week of January 11th for vaginosis and then on January 18th was given amoxicillin and tinidazole. She denies any prior problem with any of these medications.   The patient denies any sexual contacts in the last six months. She has five tattoos in all. Her last one was a year ago or more. She works for [employer], and she has had hepatitis A and B vaccinations. She denies any foreign travel, denies eating any wild mushrooms, denies taking any significant amounts of Tylenol. She has not had any alcohol in a couple of weeks. She usually drinks a couple of drinks a week. She quit cigarettes long ago. She had been on phentermine for six months and lost 30 pounds, but she has been off of it for a few months.   The patient was being interviewed when the ER physician, Dr. Reita Cliche, said that arrangements had been made to transfer the patient to [facility] for her acute hepatitis just in case she has developed into full-blown liver failure. I am in agreement with this plan.   FAMILY HISTORY: Her mother has had thyroid disease.  No known autoimmune disease in her family except mother with thyroid disease of uncertain origin. Her grandfather had hepatitis C after having heart surgery and  transfusions several years ago.  LABORATORY, DIAGNOSTIC AND RADIOLOGICAL DATA:  Liver functions: Total protein 8, albumin 3.7, total bilirubin 8.8, alkaline phosphatase 161, SGOT greater than 2000, and SGPT 1903.  White count 8, hemoglobin 15.2, platelet count 238.  INR 1.5 with a pro time of 18.6.  Urinalysis: Bilirubin 2+.  Acetaminophen level less than 2.0.  Glucose 139, BUN 8, creatinine 0.82, sodium 141, potassium 3.4, chloride 105, CO2 23, lipase 312.  Beta HCG less than 1.0.   ASSESSMENT: Acute significantly elevated liver functions: Viral hepatitis such as CMV, Epstein-Barr virus could be possible. She has been vaccinated for hepatitis A and B, and that makes these much less likely. Rarely, acute cases of autoimmune liver disease or Wilson's disease can occur. She is getting a little up in age for Wilson's disease. She is the right age for autoimmune liver disease. Amoxicillin certainly is a possible cause, and she received that about three weeks ago, took a two-week course of it. This could well be a likely culprit.    PLAN: She will be transferred to [facility] and followed there in case she develops acute liver failure.   ____________________________ Manya Silvas, MD rte:cbb D: 05/20/2011 18:21:24 ET T: 05/20/2011 19:02:25 ET JOB#: [id number]  cc: Manya Silvas, MD, <Dictator> Manya Silvas MD ELECTRONICALLY SIGNED 06/05/2011 14:49

## 2014-07-24 NOTE — Discharge Summary (Signed)
PATIENT NAME:  Joy Mason, Joy Mason MR#:  [id number] DATE OF BIRTH:  28-Sep-1975  DATE OF ADMISSION:  05/20/2011 DATE OF DISCHARGE:  05/21/2011  DISPOSITION: The patient is awaiting an open bed at [facility] and is on the transfer list.   CHIEF COMPLAINT: Elevated liver function tests, hepatitis.   CONSULTANTS: Dr. Gaylyn Cheers from GI   PRIMARY CARE PHYSICIAN: Dr. Marcie Bal Dear at [facility] OB[facility]GYN at [place]: The patient is a 39 year old female who was sent over from the OB/GYN office after blood work showed a significant elevation in the liver function tests. The patient has been recently treated with tinidazole 500 mg b.i.d. and amoxicillin 500 mg t.i.d. for bacterial vaginosis after Flagyl failure. The patient took amoxicillin and tinidazole for 14 days and has been off of it for 8 days. The patient has been experiencing some hot flashes, dizziness, shakes and some nausea. She started to have a yellowish tinge to her skin, and felt more sweaty, and was noted to have significantly elevated AST of greater than 2000 and ALT of 1903 with elevated bilirubin on arrival. While in the Emergency Department,  Hospitalist Service was contacted for consults, who recommended transfer to a tertiary care facility in case of fulminant hepatitis, which was also recommended by the GI physician, Dr. Vira Agar. The transfer request was done, and the patient was accepted for [facility]; however, the patient has had no bed and therefore was admitted to the Hospitalist Service.   SIGNIFICANT LABORATORY, DIAGNOSTIC AND RADIOLOGICAL DATA:  Initial creatinine on arrival was 0.82, glucose 139, sodium 141, potassium 3.4, ammonia level elevated at 58, lipase 312. Initial AST was greater than 2000, ALT 1903, alkaline phosphatase 161, total bilirubin 8.8, albumin is 3.7, and total protein is 8.0. Repeat LFTs show bilirubin today decreased to 7.5. Alkaline phosphatase is within normal limits of 124, ALT 1527 and ALT  1355. WBC is 8, hemoglobin on arrival 15.2, hematocrit 45.1, platelets 238.  Initial INR 1.5, today's INR 1.7. Initial PT 18.6, and today is 20.2.  Serum acetaminophen level is less than 2.0.  Iron binding capacity is 266. Iron saturation is 28.  TIBC is 368.  Last blood glucose was 148.  Ultrasound of the abdomen shows no gallstones. The gallbladder appears contracted, and the gallbladder wall is thickened. This is nonspecific in a non n.p.o. patient.   HOSPITAL COURSE: The patient was admitted to the Hospitalist Service on 05/20/2011 and is awaiting a bed at [facility]. The cause of the acute hepatitis is unclear at this point, and extensive work-up has been initiated here. The patient hemodynamically is stable with stable blood pressure. She is calm, pleasant, without tremor or asterixis. However, she has slightly up-trending PT and INR. Platelets are normal. Possible etiologies for this acute hepatitis which appears to be hepatocellular mostly with some cholestasis element are drug-related, i.e. amoxicillin versus autoimmune hepatitis versus a possible viral etiology. The patient does not use any needles nor has had any recent tattoos. Her last tattoo was about a year ago, and since then she claims that she has had negative hepatitis C blood work. Work-up here, including ANA, SPEP, ceruloplasmin, anti-smooth muscle antibody, hepatitis panel, CMV and Epstein-Barr virus have been ordered. Serum ammonia level is elevated; however, the patient is not encephalopathic. The patient is aware that she is at a high risk of developing fulminant hepatitis and possibly death and is okay for transfer to [facility]. We will continue the IV fluids which have been started and  monitor LFTs, blood glucose and coags  here.   DISPOSITION: Transfer to [facility] for further care.   CODE STATUS:  The patient is FULL CODE.     CURRENT DIAGNOSES:  1. Acute hepatitis of unclear etiology.  2. Recent bacterial vaginosis.   CURRENT  MEDICATIONS:  1. Zofran p.r.n.  2. Normal saline at 125 mg/hour.   TOTAL TIME SPENT: 40 minutes.  ____________________________ Vivien Presto, MD sa:cbb D: 05/21/2011 16:35:34 ET T: 05/21/2011 17:10:31 ET JOB#: [id number] cc: Vivien Presto, MD, <Dictator> Marcie Bal Dear, MD Vivien Presto MD ELECTRONICALLY SIGNED 06/01/2011 8:55

## 2014-07-24 NOTE — Consult Note (Signed)
PATIENT NAME:  Joy Mason, Joy Mason MR#:  [id number] DATE OF BIRTH:  1975-10-11  DATE OF CONSULTATION:  05/20/2011  REFERRING PHYSICIAN:   CONSULTING PHYSICIAN:  Tolbert Matheson S. Manuella Ghazi, MD  ADDENDUM:  The patient was waiting to get transferred to [facility] but they do not have beds. Accepting physician is okay with she will need to be admitted to inpatient status as ED there does not have any beds at this time. At this time will go ahead and bring her in and order full GI workup including ANA, SPEP, ceruloplasmin, anti-smooth muscle antibody, hepatitis panel, CMV, and EBV panel along with serum ammonia. Also check daily pro-times and liver functions along with fingerstick glucose every eight hours as she is at high risk for getting decompensated acutely. She will be watched very carefully. If she worsens, certainly other tertiary care center may need to be considered if [facility] does not open up with a bed in the near future. This was discussed with Dr. Vira Agar again and he will see the patient if patient is still here in the morning.   ____________________________ Terius Jacuinde S. Manuella Ghazi, MD vss:drc D: 05/20/2011 20:17:24 ET T: 05/21/2011 05:44:14 ET JOB#: [id number]  cc: Latorria Zeoli S. Manuella Ghazi, MD, <Dictator> Lucina Mellow [facility] MD ELECTRONICALLY SIGNED 05/21/2011 16:16

## 2014-07-24 NOTE — Consult Note (Signed)
PATIENT NAME:  Joy Mason, KONTZ MR#:  [id number] DATE OF BIRTH:  May 21, 1975  DATE OF CONSULTATION:  05/21/2011  REFERRING PHYSICIAN:   CONSULTING PHYSICIAN:  Manya Silvas, MD  ADDENDUM:  Continuation and addendum to consultation.   The physical examination was not done last night because the patient was supposed to go to [facility]. She was not transferred.   PHYSICAL EXAMINATION:   GENERAL:  On examination today, the patient is pleasant, awake, alert, and oriented.   HEENT: Sclerae are icteric. Tongue negative. Head is atraumatic.   CHEST: Clear.   HEART: Regular rate and rhythm.   ABDOMEN: Mild obesity. No palpable significant tenderness. No peritoneal signs.   NEURO: There is no flap to outstretched hands. She was able to subtract 7 from 100, but otherwise does not do mental math well, admittedly.   EXTREMITIES: No edema.   LABS: There is a significant fall in SGOT and SGPT from 2000 to 1500. Total bilirubin is down from 8.8 to 7.5. White count is 7.3, hemoglobin 13, and platelet count 199. Pro time is up a bit to 20.2 with an INR of 1.7   ASSESSMENT/RECOMMENDATIONS: Acute hepatitis, etiology most likely from amoxicillin. The gallbladder on ultrasound showed slightly thickened wall, a nonspecific finding     in a patient who has not been n.p.o. The common bile duct is normal and there is no dilated intrahepatic bile ducts. We will continue to follow with you. ____________________________ Manya Silvas, MD rte:slb D: 05/21/2011 10:31:39 ET T: 05/21/2011 10:36:57 ET JOB#: [id number]  cc: Manya Silvas, MD, <Dictator> Manya Silvas MD ELECTRONICALLY SIGNED 06/05/2011 14:49

## 2014-07-29 ENCOUNTER — Telehealth: Payer: Self-pay | Admitting: *Deleted

## 2014-07-29 DIAGNOSIS — B379 Candidiasis, unspecified: Secondary | ICD-10-CM

## 2014-07-29 MED ORDER — FLUCONAZOLE 150 MG PO TABS: 150.0000 mg | ORAL_TABLET | Freq: Once | ORAL | Status: DC

## 2014-07-29 NOTE — Telephone Encounter (Signed)
Patient called and said she needed something sent in for a yeast infection.  Diflucan sent in.

## 2014-08-02 ENCOUNTER — Ambulatory Visit (INDEPENDENT_AMBULATORY_CARE_PROVIDER_SITE_OTHER): Payer: No Typology Code available for payment source | Admitting: Obstetrics & Gynecology

## 2014-08-02 ENCOUNTER — Other Ambulatory Visit: Payer: Self-pay | Admitting: Obstetrics & Gynecology

## 2014-08-02 ENCOUNTER — Encounter: Payer: Self-pay | Admitting: Obstetrics & Gynecology

## 2014-08-02 VITALS — BP 117/84 | HR 108 | Ht 65.0 in | Wt 240.4 lb

## 2014-08-02 DIAGNOSIS — N898 Other specified noninflammatory disorders of vagina: Secondary | ICD-10-CM

## 2014-08-02 DIAGNOSIS — L298 Other pruritus: Secondary | ICD-10-CM

## 2014-08-02 DIAGNOSIS — A5901 Trichomonal vulvovaginitis: Secondary | ICD-10-CM | POA: Diagnosis not present

## 2014-08-02 MED ORDER — METRONIDAZOLE 500 MG PO TABS: 500.0000 mg | ORAL_TABLET | Freq: Two times a day (BID) | ORAL | Status: DC

## 2014-08-02 NOTE — Progress Notes (Signed)
   Subjective:    Patient ID: Joy Mason, female    DOB: 03/29/1976, 39 y.o.   MRN: [id number]  HPI  39 yo DW P3 (20,16, 43 yo kids) here with a one week h/o vaginal itching after having sex with an ex-boyfriend last week. She has been itching it fiercely. She tried a diflucan without relief.  Review of Systems Pap normal 2016    Objective:   Physical Exam  [id number] Breathing and ambulating normally EG- erythematous and swollen Vagina with a moderate amount of frothy white discharge      Assessment & Plan:  Vaginal itching- probable BV Flagyl prescribed Probiotics and RePhresh recommended Cortisone prn Cultures and wet prep sent

## 2014-08-03 LAB — WET PREP, GENITAL
CLUE CELLS WET PREP: NONE SEEN
YEAST WET PREP: NONE SEEN

## 2014-08-03 LAB — GC/CHLAMYDIA PROBE AMP
CT PROBE, AMP APTIMA: NEGATIVE
GC Probe RNA: NEGATIVE

## 2014-08-04 ENCOUNTER — Telehealth: Payer: Self-pay | Admitting: *Deleted

## 2014-08-04 NOTE — Telephone Encounter (Signed)
Left message for patient to call back regarding test results.

## 2014-08-04 NOTE — Telephone Encounter (Signed)
Advised patient of positive test results.  She states that she is taking the medication already prescribed and the symptoms are already better.

## 2014-12-27 ENCOUNTER — Encounter: Payer: Self-pay | Admitting: Physician Assistant

## 2014-12-27 ENCOUNTER — Telehealth: Payer: Self-pay | Admitting: Physician Assistant

## 2014-12-27 NOTE — Telephone Encounter (Signed)
Patient contacted office requesting a Zpack Stated that cold had moved to her chest and you instructed her to call if not better would call in antibiotic and was last seen 1 wk ago

## 2014-12-27 NOTE — Telephone Encounter (Signed)
Rx called into pharmacy. Per Rudene Anda

## 2015-04-19 ENCOUNTER — Ambulatory Visit: Payer: Self-pay | Admitting: Physician Assistant

## 2015-04-19 ENCOUNTER — Encounter: Payer: Self-pay | Admitting: Physician Assistant

## 2015-04-19 VITALS — BP 110/70 | HR 85 | Temp 98.2°F

## 2015-04-19 DIAGNOSIS — R5383 Other fatigue: Secondary | ICD-10-CM

## 2015-04-19 NOTE — Progress Notes (Signed)
S: c/o being tired a lot, here for yearly labs, has been taking b12 on and off to see if it would help, states is waking up a lot during the night, hasn't tried otc meds yet, no other complaints, ros neg  O: vitals wnl, nad, ENT wnl, neck supple no lymph, lungs c t a, cv rrr, neuro intact, good mood and affect  A: fatigue ? Due to interrupted sleep pattern  P: labs with exec panel, vit d, b12, urged pt to try otc melatonin

## 2015-04-20 LAB — CMP12+LP+TP+TSH+6AC+CBC/D/PLT
ALBUMIN: 4.4 g/dL (ref 3.5–5.5)
ALT: 15 IU/L (ref 0–32)
AST: 19 IU/L (ref 0–40)
Albumin/Globulin Ratio: 1.6 (ref 1.1–2.5)
Alkaline Phosphatase: 45 IU/L (ref 39–117)
BASOS: 1 %
BUN / CREAT RATIO: 19 (ref 9–23)
BUN: 14 mg/dL (ref 6–24)
Basophils Absolute: 0.1 10*3/uL (ref 0.0–0.2)
Bilirubin Total: 0.4 mg/dL (ref 0.0–1.2)
CHLORIDE: 101 mmol/L (ref 96–106)
CREATININE: 0.72 mg/dL (ref 0.57–1.00)
Calcium: 9.3 mg/dL (ref 8.7–10.2)
Chol/HDL Ratio: 2.8 ratio units (ref 0.0–4.4)
Cholesterol, Total: 167 mg/dL (ref 100–199)
EOS (ABSOLUTE): 0.1 10*3/uL (ref 0.0–0.4)
EOS: 2 %
FREE THYROXINE INDEX: 2.7 (ref 1.2–4.9)
GFR calc Af Amer: 120 mL/min/{1.73_m2} (ref >59)
GFR, EST NON AFRICAN AMERICAN: 104 mL/min/{1.73_m2} (ref >59)
GGT: 11 IU/L (ref 0–60)
GLUCOSE: 93 mg/dL (ref 65–99)
Globulin, Total: 2.8 g/dL (ref 1.5–4.5)
HDL: 60 mg/dL (ref >39)
HEMATOCRIT: 42.6 % (ref 34.0–46.6)
Hemoglobin: 14.1 g/dL (ref 11.1–15.9)
IMMATURE GRANULOCYTES: 0 %
Immature Grans (Abs): 0 10*3/uL (ref 0.0–0.1)
Iron: 87 ug/dL (ref 27–159)
LDH: 202 IU/L (ref 119–226)
LDL CALC: 92 mg/dL (ref 0–99)
LYMPHS ABS: 2 10*3/uL (ref 0.7–3.1)
Lymphs: 28 %
MCH: 30.4 pg (ref 26.6–33.0)
MCHC: 33.1 g/dL (ref 31.5–35.7)
MCV: 92 fL (ref 79–97)
MONOS ABS: 0.5 10*3/uL (ref 0.1–0.9)
Monocytes: 7 %
NEUTROS ABS: 4.3 10*3/uL (ref 1.4–7.0)
NEUTROS PCT: 62 %
PHOSPHORUS: 4.3 mg/dL (ref 2.5–4.5)
POTASSIUM: 4.7 mmol/L (ref 3.5–5.2)
Platelets: 265 10*3/uL (ref 150–379)
RBC: 4.64 x10E6/uL (ref 3.77–5.28)
RDW: 13.7 % (ref 12.3–15.4)
SODIUM: 140 mmol/L (ref 134–144)
T3 Uptake Ratio: 32 % (ref 24–39)
T4, Total: 8.5 ug/dL (ref 4.5–12.0)
TOTAL PROTEIN: 7.2 g/dL (ref 6.0–8.5)
TRIGLYCERIDES: 75 mg/dL (ref 0–149)
TSH: 1.75 u[IU]/mL (ref 0.450–4.500)
URIC ACID: 5.7 mg/dL (ref 2.5–7.1)
VLDL Cholesterol Cal: 15 mg/dL (ref 5–40)
WBC: 6.9 10*3/uL (ref 3.4–10.8)

## 2015-04-20 LAB — VITAMIN D 25 HYDROXY (VIT D DEFICIENCY, FRACTURES): VIT D 25 HYDROXY: 18.1 ng/mL — AB (ref 30.0–100.0)

## 2015-04-20 LAB — B12 AND FOLATE PANEL
FOLATE: 14.1 ng/mL (ref >3.0)
VITAMIN B 12: 1099 pg/mL — AB (ref 211–946)

## 2015-04-27 NOTE — Progress Notes (Signed)
Left message for patient to return my call.

## 2015-04-27 NOTE — Progress Notes (Signed)
I spoke to the patient about her lab results and she expressed understanding.  Patient will return in 3 months to recheck her Vitamin D levels.  A script for Vitamin D 50000u was called in to [facility] on [address].

## 2015-06-12 ENCOUNTER — Encounter: Payer: Self-pay | Admitting: Physician Assistant

## 2015-06-19 ENCOUNTER — Encounter: Payer: Self-pay | Admitting: Physician Assistant

## 2015-06-19 ENCOUNTER — Ambulatory Visit: Payer: Self-pay | Admitting: Physician Assistant

## 2015-06-19 VITALS — BP 125/80 | HR 72 | Temp 98.4°F

## 2015-06-19 DIAGNOSIS — R05 Cough: Secondary | ICD-10-CM

## 2015-06-19 DIAGNOSIS — R059 Cough, unspecified: Secondary | ICD-10-CM

## 2015-06-19 MED ORDER — AZITHROMYCIN 250 MG PO TABS: ORAL_TABLET | ORAL | Status: DC

## 2015-06-19 NOTE — Progress Notes (Signed)
S: C/o cough for 1 month, no fever, chills, cp/sob, v/d; mucus was green this am but clear throughout the day, cough is sporadic, entire family has been sick, tired of coughing,   Using otc meds: multiple  O: PE: vitals wnl, nad, perrl eomi, normocephalic, tms dull, nasal mucosa red and swollen, throat injected, neck supple no lymph, lungs c t a, cv rrr, neuro intact  A:  Acute uri   P: zpack, drink fluids, continue regular meds , use otc meds of choice, return if not improving in 5 days, return earlier if worsening

## 2015-08-11 ENCOUNTER — Other Ambulatory Visit: Payer: Self-pay

## 2015-08-11 DIAGNOSIS — Z0000 Encounter for general adult medical examination without abnormal findings: Secondary | ICD-10-CM

## 2015-08-11 NOTE — Progress Notes (Signed)
Patient came in to have blood drawn to recheck her Vitamin D and B12 levels per Susan's orders.  Blood was drawn from the right arm without any incident.

## 2015-08-12 LAB — VITAMIN B12: Vitamin B-12: 590 pg/mL (ref 211–946)

## 2015-08-12 LAB — VITAMIN D 25 HYDROXY (VIT D DEFICIENCY, FRACTURES): Vit D, 25-Hydroxy: 36.7 ng/mL (ref 30.0–100.0)

## 2015-08-21 ENCOUNTER — Other Ambulatory Visit (HOSPITAL_COMMUNITY): Payer: Self-pay | Admitting: Obstetrics & Gynecology

## 2015-08-21 DIAGNOSIS — Z1231 Encounter for screening mammogram for malignant neoplasm of breast: Secondary | ICD-10-CM

## 2015-08-25 ENCOUNTER — Ambulatory Visit
Admission: RE | Admit: 2015-08-25 | Discharge: 2015-08-25 | Disposition: A | Discharge status: Home / Self Care | Payer: 59 | Source: Ambulatory Visit | Attending: Obstetrics & Gynecology | Admitting: Obstetrics & Gynecology

## 2015-08-25 DIAGNOSIS — Z1231 Encounter for screening mammogram for malignant neoplasm of breast: Secondary | ICD-10-CM | POA: Insufficient documentation

## 2015-08-31 ENCOUNTER — Encounter: Payer: Self-pay | Admitting: Obstetrics & Gynecology

## 2015-08-31 ENCOUNTER — Ambulatory Visit (INDEPENDENT_AMBULATORY_CARE_PROVIDER_SITE_OTHER): Payer: Managed Care, Other (non HMO) | Admitting: Obstetrics & Gynecology

## 2015-08-31 VITALS — BP 115/77 | HR 64 | Ht 65.0 in | Wt 241.0 lb

## 2015-08-31 DIAGNOSIS — N920 Excessive and frequent menstruation with regular cycle: Secondary | ICD-10-CM

## 2015-08-31 DIAGNOSIS — Z01419 Encounter for gynecological examination (general) (routine) without abnormal findings: Secondary | ICD-10-CM | POA: Diagnosis not present

## 2015-08-31 LAB — CBC
HCT: 42.3 % (ref 35.0–45.0)
Hemoglobin: 13.9 g/dL (ref 11.7–15.5)
MCH: 29.9 pg (ref 27.0–33.0)
MCHC: 32.9 g/dL (ref 32.0–36.0)
MCV: 91 fL (ref 80.0–100.0)
MPV: 9.6 fL (ref 7.5–12.5)
PLATELETS: 271 10*3/uL (ref 140–400)
RBC: 4.65 MIL/uL (ref 3.80–5.10)
RDW: 13.6 % (ref 11.0–15.0)
WBC: 7.5 10*3/uL (ref 3.8–10.8)

## 2015-08-31 LAB — TSH: TSH: 1.94 m[IU]/L

## 2015-08-31 NOTE — Progress Notes (Addendum)
Subjective:    Joy Mason is a 40 y.o. DW P3 here female who presents for an annual exam. The patient has no complaints today except for "extremely heavy periods". During the first 3 days she has to change her Super Plus tampon and pad at the same time every hour while at work. She bleeds into her sheets at night. Sometimes has to leave work. The patient is not currently sexually active. GYN screening history: last pap: was normal. The patient wears seatbelts: yes. The patient participates in regular exercise: yes. Has the patient ever been transfused or tattooed?: yes. The patient reports that there is not domestic violence in her life.   Menstrual History: OB History    Gravida Para Term Preterm AB TAB SAB Ectopic Multiple Living   3 3 3  0 0 0 0 0 0 3      Menarche age: 60  Patient's last menstrual period was 08/14/2015.    The following portions of the patient's history were reviewed and updated as appropriate: allergies, current medications, past family history, past medical history, past social history, past surgical history and problem list.  Review of Systems Pertinent items are noted in HPI.  Abstinent for 6 months. 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 She gets her fasting labs through her job and says that they are great. Mammogram recent and normal.   Objective:    BP 115/77 mmHg  Pulse 64  Ht 5\' 5"  (1.651 m)  Wt 241 lb (109.317 kg)  BMI 40.10 kg/m2  LMP 08/14/2015  General Appearance:    Alert, cooperative, no distress, appears stated age  Head:    Normocephalic, without obvious abnormality, atraumatic  Eyes:    PERRL, conjunctiva/corneas clear, EOM's intact, fundi    benign, both eyes  Ears:    Normal TM's and external ear canals, both ears  Nose:   Nares normal, septum midline, mucosa normal, no drainage    or sinus tenderness  Throat:   Lips, mucosa, and tongue normal; teeth and gums normal  Neck:   Supple, symmetrical, trachea midline,  no adenopathy;    thyroid:  no enlargement/tenderness/nodules; no carotid   bruit or JVD  Back:     Symmetric, no curvature, ROM normal, no CVA tenderness  Lungs:     Clear to auscultation bilaterally, respirations unlabored  Chest Wall:    No tenderness or deformity   Heart:    Regular rate and rhythm, S1 and S2 normal, no murmur, rub   or gallop  Breast Exam:    No tenderness, masses, or nipple abnormality  Abdomen:     Soft, non-tender, bowel sounds active all four quadrants,    no masses, no organomegaly  Genitalia:    Normal female without lesion, discharge or tenderness     Extremities:   Extremities normal, atraumatic, no cyanosis or edema  Pulses:   2+ and symmetric all extremities  Skin:   Skin color, texture, turgor normal, no rashes or lesions  Lymph nodes:   Cervical, supraclavicular, and axillary nodes normal  Neurologic:   CNII-XII intact, normal strength, sensation and reflexes    throughout  .    Assessment:    Healthy female exam.   Menorrhagia   Plan:     Breast self exam technique reviewed and patient encouraged to perform self-exam monthly.   Check TSH, CBC, gyn u/s, consider endometrial ablation Contraception- possible LBS

## 2015-09-08 ENCOUNTER — Ambulatory Visit: Payer: Managed Care, Other (non HMO)

## 2015-10-10 ENCOUNTER — Ambulatory Visit (INDEPENDENT_AMBULATORY_CARE_PROVIDER_SITE_OTHER): Payer: Managed Care, Other (non HMO) | Admitting: Obstetrics & Gynecology

## 2015-10-10 DIAGNOSIS — N938 Other specified abnormal uterine and vaginal bleeding: Secondary | ICD-10-CM

## 2015-10-10 NOTE — Progress Notes (Signed)
   Subjective:    Patient ID: Joy Mason, female    DOB: [birth year], 40 y.o.   MRN: [id number]  HPI She did not get her u/s as her bleeding has returned to normal. She will RTC prn   Review of Systems     Objective:   Physical Exam        Assessment & Plan:

## 2016-01-10 ENCOUNTER — Ambulatory Visit: Payer: Self-pay | Admitting: Physician Assistant

## 2016-01-10 ENCOUNTER — Encounter: Payer: Self-pay | Admitting: Physician Assistant

## 2016-01-10 VITALS — BP 121/80 | HR 61 | Temp 97.9°F

## 2016-01-10 DIAGNOSIS — M25561 Pain in right knee: Secondary | ICD-10-CM

## 2016-01-10 MED ORDER — METHYLPREDNISOLONE 4 MG PO TBPK: ORAL_TABLET | ORAL | 0 refills | Status: DC

## 2016-01-10 NOTE — Progress Notes (Signed)
S: pt has hx of arthritis in both knees, has been climbing a lot of stairs bc they are moving to another house, felt a loud pop in her knee yesterday, has increased swelling in r knee, pain with bending knee, hurts to bw, called ortho and cannot be seen until Friday  O: vitals wnl, nad, skin intact, no bruising or redness noted, no increased warmth at knee, decreased rom with extension, small amount of swelling anteriorly and posteriorly, tender at joint line and posteriorly, n/v intact  A: acute knee pain, possible torn meniscus or acl  P: medrol dose pack, ice, elevate, crutches given, pt to f/u with ortho, has knee brace at home

## 2016-07-04 ENCOUNTER — Ambulatory Visit: Payer: Self-pay | Admitting: Physician Assistant

## 2016-07-04 ENCOUNTER — Encounter: Payer: Self-pay | Admitting: Physician Assistant

## 2016-07-04 VITALS — BP 120/80 | HR 72 | Ht 64.0 in | Wt 240.0 lb

## 2016-07-04 DIAGNOSIS — Z0000 Encounter for general adult medical examination without abnormal findings: Secondary | ICD-10-CM

## 2016-07-04 NOTE — Progress Notes (Signed)
S: pt here for wellness physical for insurance purposes, no complaints ros neg. Last mammogram was about a year ago PMH:   Neg  Social: nonsmoker, occ etoh, no drugs  Fam: breast CA, DM, HTN, hep c  O: vitals wnl, nad, ENT wnl, neck supple no lymph, lungs c t a, cv rrr, abd soft nontender bs normal all 4 quads  A: wellness physical  P: mammogram ordered, f/u with gyn for yearly pap

## 2016-10-11 ENCOUNTER — Ambulatory Visit
Admission: RE | Admit: 2016-10-11 | Discharge: 2016-10-11 | Disposition: A | Discharge status: Home / Self Care | Payer: Managed Care, Other (non HMO) | Source: Ambulatory Visit | Attending: Physician Assistant | Admitting: Physician Assistant

## 2016-10-11 DIAGNOSIS — R928 Other abnormal and inconclusive findings on diagnostic imaging of breast: Secondary | ICD-10-CM | POA: Diagnosis not present

## 2016-10-11 DIAGNOSIS — Z0000 Encounter for general adult medical examination without abnormal findings: Secondary | ICD-10-CM

## 2016-10-11 DIAGNOSIS — Z1231 Encounter for screening mammogram for malignant neoplasm of breast: Secondary | ICD-10-CM | POA: Insufficient documentation

## 2016-10-11 DIAGNOSIS — N6489 Other specified disorders of breast: Secondary | ICD-10-CM | POA: Insufficient documentation

## 2016-10-15 ENCOUNTER — Other Ambulatory Visit: Payer: Self-pay | Admitting: Physician Assistant

## 2016-10-15 DIAGNOSIS — N6489 Other specified disorders of breast: Secondary | ICD-10-CM

## 2016-10-15 DIAGNOSIS — R928 Other abnormal and inconclusive findings on diagnostic imaging of breast: Secondary | ICD-10-CM

## 2016-10-25 ENCOUNTER — Ambulatory Visit: Payer: Managed Care, Other (non HMO)

## 2016-10-25 ENCOUNTER — Other Ambulatory Visit: Payer: Self-pay

## 2016-11-08 ENCOUNTER — Ambulatory Visit
Admission: RE | Admit: 2016-11-08 | Discharge: 2016-11-08 | Disposition: A | Discharge status: Home / Self Care | Payer: Managed Care, Other (non HMO) | Source: Ambulatory Visit | Attending: Physician Assistant | Admitting: Physician Assistant

## 2016-11-08 DIAGNOSIS — R928 Other abnormal and inconclusive findings on diagnostic imaging of breast: Secondary | ICD-10-CM

## 2016-11-08 DIAGNOSIS — N6489 Other specified disorders of breast: Secondary | ICD-10-CM

## 2017-01-30 ENCOUNTER — Ambulatory Visit: Payer: Self-pay | Admitting: Physician Assistant

## 2017-01-30 VITALS — BP 110/70 | HR 72 | Temp 98.7°F | Resp 16

## 2017-01-30 DIAGNOSIS — J0100 Acute maxillary sinusitis, unspecified: Secondary | ICD-10-CM

## 2017-01-30 MED ORDER — AZITHROMYCIN 250 MG PO TABS: ORAL_TABLET | ORAL | 0 refills | Status: DC

## 2017-01-30 MED ORDER — FLUTICASONE PROPIONATE 50 MCG/ACT NA SUSP: 2.0000 | Freq: Every day | NASAL | 6 refills | Status: DC

## 2017-01-30 NOTE — Progress Notes (Signed)
S: C/o runny nose and congestion for 5 days, no fever, chills, cp/sob, v/d; mucus is yellow and thick, cough is sporadic, c/o of facial and dental pain.   Using otc meds:   O: PE: vitals wnl, nad, perrl eomi, normocephalic, tms dull, nasal mucosa red and swollen, throat injected, neck supple no lymph, lungs c t a, cv rrr, neuro intact  A:  Acute sinusitis   P: drink fluids, continue regular meds , use otc meds of choice, return if not improving in 5 days, return earlier if worsening , zpack, flonase

## 2017-03-11 ENCOUNTER — Ambulatory Visit: Payer: Self-pay | Admitting: Emergency Medicine

## 2017-03-11 VITALS — BP 110/70 | HR 86 | Temp 98.0°F | Resp 16

## 2017-03-11 DIAGNOSIS — J069 Acute upper respiratory infection, unspecified: Secondary | ICD-10-CM

## 2017-03-11 MED ORDER — BENZONATATE 100 MG PO CAPS: ORAL_CAPSULE | ORAL | 0 refills | Status: DC

## 2017-03-11 NOTE — Progress Notes (Signed)
S: Here with cough, cold sx for a couple of days.  Has been taking Alka-Seltzer with some relief of drainage but not with cough.   O:  TMs dull bilat, nose boggy, throat post drainage.  Neck supple without aden.  Lungs clear bilat.  Heart RRR A:  URI P:  Tessalon 100mg  1-2 every 8 hours prn cough

## 2017-06-05 ENCOUNTER — Encounter: Payer: Self-pay | Admitting: Family Medicine

## 2017-06-05 ENCOUNTER — Ambulatory Visit: Payer: Self-pay | Admitting: Family Medicine

## 2017-06-05 VITALS — BP 120/82 | HR 74 | Temp 98.1°F | Resp 16 | Ht 64.0 in | Wt 237.0 lb

## 2017-06-05 DIAGNOSIS — Z0189 Encounter for other specified special examinations: Principal | ICD-10-CM

## 2017-06-05 DIAGNOSIS — Z008 Encounter for other general examination: Secondary | ICD-10-CM

## 2017-06-05 NOTE — Progress Notes (Signed)
Subjective: Annual biometrics screening  Patient presents for her annual biometric screening.  Patient has a history of osteoarthritis in her knees bilaterally. Denies any functional limitations, just  occasional pain and stiffness.  Denies any other medical problems.  Patient is currently recovering from an upper respiratory infection with her only symptoms being mild nasal congestion and a mild nonproductive cough, which is improving. PCP: Dr. Francoise Schaumann, although patient is looking for a new primary care provider due to convenience. 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 Patient denies any other issues or concerns.    Review of Systems Constitutional: Unremarkable.  HEENT: Denies dizziness, issues with hearing, vision problems.  Gastrointestinal: Denies issues with bowel or bladder.  Respiratory: Unremarkable.   Cardiovascular: Unremarkable.  Musculoskeletal: No arthralgias, myalgias, joint swelling, joint stiffness, back pain, neck pain. ROS otherwise negative.   Objective  Physical Exam General: Awake, alert and oriented. No acute distress. Well developed, hydrated and nourished. Appears stated age.  HEENT:  Supple neck without adenopathy. Sclera is non-icteric. The ear canal is clear without discharge. The tympanic membrane is normal in appearance with normal landmarks and cone of light. Nasal mucosa with mild erythema and edema. Oral mucosa is pink and moist. The pharynx is normal in appearance without tonsillar swelling or exudates.  Skin: Skin in warm, dry and intact without rashes or lesions. Appropriate color for ethnicity. Cardiac: Heart rate and rhythm are normal. No murmurs, gallops, or rubs are auscultated.  Respiratory: The chest wall is symmetric and without deformity. No signs of respiratory distress. Lung sounds are clear in all lobes bilaterally without rales, ronchi, or wheezes.   Abdominal: Abdomen is soft, symmetric, and non-tender without distention. No masses,  hepatomegaly, or splenomegaly are noted.  Spine: Neck and back are without deformity. Curvature of the cervical, thoracic, and lumbar spine are within normal limits. Posture is upright, gait is smooth, steady, and within normal limits. No tenderness noted on palpation of the spinous processes. Spinous processes are midline. No discomfort is noted with flexion, extension, and side-to-side rotation of the cervical spine, full range of motion is noted. Full range of motion including flexion, extension, and side-to-side rotation of the thoracic and lumbar spine are noted and without discomfort. Grip strength is normal bilaterally. Dorsi/plantar flexion is normal bilaterally.  Neurological: The patient is awake, alert and oriented to person, place, and time with normal speech.  Memory is normal and thought processes intact. No gait abnormalities are appreciated.  Psychiatric: Appropriate mood and affect.   Assessment Annual biometrics screen  Plan  Labs pending. Encouraged routine visits with primary care provider And provided patient with a list of local primary care providers that are accepting new patients.  Discussed over-the-counter treatments for her upper respiratory infection.

## 2017-06-06 LAB — CMP12+LP+TP+TSH+6AC+CBC/D/PLT
ALBUMIN: 3.9 g/dL (ref 3.5–5.5)
ALK PHOS: 44 IU/L (ref 39–117)
ALT: 16 IU/L (ref 0–32)
AST: 17 IU/L (ref 0–40)
Albumin/Globulin Ratio: 1.3 (ref 1.2–2.2)
BASOS: 1 %
BUN / CREAT RATIO: 17 (ref 9–23)
BUN: 12 mg/dL (ref 6–24)
Basophils Absolute: 0.1 10*3/uL (ref 0.0–0.2)
Bilirubin Total: 0.2 mg/dL (ref 0.0–1.2)
CALCIUM: 9.2 mg/dL (ref 8.7–10.2)
CHLORIDE: 105 mmol/L (ref 96–106)
CHOLESTEROL TOTAL: 156 mg/dL (ref 100–199)
CREATININE: 0.71 mg/dL (ref 0.57–1.00)
Chol/HDL Ratio: 2.7 ratio (ref 0.0–4.4)
EOS (ABSOLUTE): 0.2 10*3/uL (ref 0.0–0.4)
EOS: 2 %
FREE THYROXINE INDEX: 2 (ref 1.2–4.9)
GFR calc Af Amer: 121 mL/min/{1.73_m2} (ref >59)
GFR, EST NON AFRICAN AMERICAN: 105 mL/min/{1.73_m2} (ref >59)
GGT: 10 IU/L (ref 0–60)
GLUCOSE: 85 mg/dL (ref 65–99)
Globulin, Total: 3 g/dL (ref 1.5–4.5)
HDL: 58 mg/dL (ref >39)
HEMATOCRIT: 38.8 % (ref 34.0–46.6)
HEMOGLOBIN: 12.8 g/dL (ref 11.1–15.9)
IMMATURE GRANULOCYTES: 0 %
Immature Grans (Abs): 0 10*3/uL (ref 0.0–0.1)
Iron: 31 ug/dL (ref 27–159)
LDH: 221 IU/L (ref 119–226)
LDL CALC: 79 mg/dL (ref 0–99)
Lymphocytes Absolute: 2.3 10*3/uL (ref 0.7–3.1)
Lymphs: 23 %
MCH: 29.5 pg (ref 26.6–33.0)
MCHC: 33 g/dL (ref 31.5–35.7)
MCV: 89 fL (ref 79–97)
MONOS ABS: 0.8 10*3/uL (ref 0.1–0.9)
Monocytes: 8 %
NEUTROS ABS: 6.7 10*3/uL (ref 1.4–7.0)
NEUTROS PCT: 66 %
POTASSIUM: 4.3 mmol/L (ref 3.5–5.2)
Phosphorus: 3.4 mg/dL (ref 2.5–4.5)
Platelets: 301 10*3/uL (ref 150–379)
RBC: 4.34 x10E6/uL (ref 3.77–5.28)
RDW: 13.6 % (ref 12.3–15.4)
SODIUM: 142 mmol/L (ref 134–144)
T3 Uptake Ratio: 28 % (ref 24–39)
T4, Total: 7.1 ug/dL (ref 4.5–12.0)
TOTAL PROTEIN: 6.9 g/dL (ref 6.0–8.5)
TSH: 1.41 u[IU]/mL (ref 0.450–4.500)
Triglycerides: 94 mg/dL (ref 0–149)
URIC ACID: 5.3 mg/dL (ref 2.5–7.1)
VLDL CHOLESTEROL CAL: 19 mg/dL (ref 5–40)
WBC: 10.1 10*3/uL (ref 3.4–10.8)

## 2017-06-06 NOTE — Progress Notes (Signed)
Blood work looks great.  Just wanted to let you know in case the patient called in regards to her results.  No further action needed.

## 2017-12-18 ENCOUNTER — Ambulatory Visit: Payer: Self-pay | Admitting: Family Medicine

## 2017-12-18 VITALS — BP 118/80 | HR 77 | Temp 98.4°F | Resp 16

## 2017-12-18 DIAGNOSIS — J0100 Acute maxillary sinusitis, unspecified: Secondary | ICD-10-CM

## 2017-12-18 MED ORDER — DOXYCYCLINE HYCLATE 100 MG PO TABS: 100.0000 mg | ORAL_TABLET | Freq: Two times a day (BID) | ORAL | 0 refills | Status: AC

## 2017-12-18 NOTE — Progress Notes (Signed)
Subjective: congestion      Joy Mason is a 42 y.o. female who presents for evaluation of nasal congestion, bilateral facial pressure, fatigue, and productive cough with green sputum for 4 days.  Patient reports a slow gradual increase in facial pressure bilaterally. Treatment to date: Sudafed, Alka-Seltzer cold, Bromfed.  Denies rash, nausea, vomiting, diarrhea, SOB, wheezing, chest or back pain, ear pain, sore throat, difficulty swallowing, confusion, headache, body aches, fever, chills,  ocular pruritis/discharge, severe symptoms, or initial improvement and then worsening of symptoms. History of smoking, asthma, COPD: Negative. History of recurrent sinus and/or lung infections: Occasional sinus infection.  Negative for lung infections. Antibiotic use in the last 3 months: Patient denies any.   Review of Systems Pertinent items noted in HPI and remainder of comprehensive ROS otherwise negative.     Objective:   Physical Exam General: Awake, alert, and oriented. No acute distress. Well developed, hydrated and nourished. Appears stated age. Nontoxic appearance.  HEENT:  PND noted.  Mild erythema to posterior oropharynx. No edema or exudates of pharynx or tonsils. No erythema or bulging of TM.  Mild erythema/edema to nasal mucosa.  Bilateral maxillary sinus tenderness.  Remainder of sinuses nontender. Supple neck without adenopathy. Cardiac: Heart rate and rhythm are normal. No murmurs, gallops, or rubs are auscultated. S1 and S2 are heard and are of normal intensity.  Respiratory: No signs of respiratory distress. Lungs clear. No tachypnea. Able to speak in full sentences without dyspnea. Nonlabored respirations.  Skin: Skin is warm, dry and intact. Appropriate color for ethnicity. No cyanosis noted.    Assessment:    sinusitis and viral upper respiratory illness   Plan:    Discussed diagnosis and treatment of URI. Discussed the diagnosis and treatment of sinusitis. Discussed  the importance of avoiding unnecessary antibiotic therapy. Suggested symptomatic OTC remedies. Nasal saline spray for congestion.   Prescribed doxycycline and discussed indications to get this filled with the patient.  Discussed side/adverse effects.  Discussed red flag symptoms and circumstances with which to seek medical care.   New Prescriptions   DOXYCYCLINE (VIBRA-TABS) 100 MG TABLET    Take 1 tablet (100 mg total) by mouth 2 (two) times daily for 7 days.

## 2018-04-10 ENCOUNTER — Encounter: Payer: Self-pay | Admitting: Obstetrics & Gynecology

## 2018-04-10 ENCOUNTER — Ambulatory Visit (INDEPENDENT_AMBULATORY_CARE_PROVIDER_SITE_OTHER): Payer: Managed Care, Other (non HMO) | Admitting: Obstetrics & Gynecology

## 2018-04-10 ENCOUNTER — Other Ambulatory Visit (HOSPITAL_COMMUNITY)
Admission: RE | Admit: 2018-04-10 | Discharge: 2018-04-10 | Disposition: A | Discharge status: Home / Self Care | Payer: Managed Care, Other (non HMO) | Source: Ambulatory Visit | Attending: Obstetrics & Gynecology | Admitting: Obstetrics & Gynecology

## 2018-04-10 VITALS — BP 112/70 | Ht 64.0 in | Wt 220.0 lb

## 2018-04-10 DIAGNOSIS — Z01419 Encounter for gynecological examination (general) (routine) without abnormal findings: Secondary | ICD-10-CM

## 2018-04-10 DIAGNOSIS — Z124 Encounter for screening for malignant neoplasm of cervix: Secondary | ICD-10-CM | POA: Insufficient documentation

## 2018-04-10 DIAGNOSIS — N921 Excessive and frequent menstruation with irregular cycle: Secondary | ICD-10-CM | POA: Insufficient documentation

## 2018-04-10 DIAGNOSIS — Z1239 Encounter for other screening for malignant neoplasm of breast: Secondary | ICD-10-CM

## 2018-04-10 NOTE — Patient Instructions (Signed)
PAP every three years Mammogram every year    Call [phone] to schedule at [facility] yearly (with PCP)   

## 2018-04-10 NOTE — Progress Notes (Signed)
HPI:      [name] is a 43 y.o. [phone] who LMP was Patient's last menstrual period was 03/27/2018., she presents today for her annual examination, new here today. The patient has complaints today of HEAVY CYCLES sometimes TWO PER MONTH, some cramping and pain with them.  FH FIBROIDS. The patient is not currently sexually active. Her last pap: approximate date 2016 and was normal and last mammogram: approximate date 2018 and was normal. The patient does perform self breast exams.  There is notable family history of breast (mother 16s) or ovarian cancer in her family.  The patient has regular exercise: yesThe patient denies current symptoms of depression.    GYN History: Contraception: abstinence  PMHx: Past Medical History:  Diagnosis Date  . Fluid retention   . Morbid obesity ([place])    Past Surgical History:  Procedure Laterality Date  . CESAREAN SECTION     Family History  Problem Relation Age of Onset  . Diabetes Mother   . Thyroid disease Mother   . Arthritis Mother   . Breast cancer Mother        lumpectomy  . Hypertension Father   . Arthritis Father   . Cancer Paternal Grandmother        breast  . Breast cancer Paternal Grandmother   . Hepatitis C Paternal Grandfather    Social History   Tobacco Use  . Smoking status: Never Smoker  . Smokeless tobacco: Never Used  Substance Use Topics  . Alcohol use: Yes    Alcohol/week: 0.0 standard drinks    Comment: social  . Drug use: No    Current Outpatient Medications:  .  fluticasone (FLONASE) 50 MCG/ACT nasal spray, Place 2 sprays into both nostrils daily. (Patient not taking: Reported on 04/10/2018), Disp: 16 g, Rfl: 6 Allergies: Sulfa antibiotics and Amoxicillin  Review of Systems  Constitutional: Negative for chills, fever and malaise/fatigue.  HENT: Negative for congestion, sinus pain and sore throat.   Eyes: Negative for blurred vision and pain.  Respiratory: Negative for cough and wheezing.     Cardiovascular: Negative for chest pain and leg swelling.  Gastrointestinal: Negative for abdominal pain, constipation, diarrhea, heartburn, nausea and vomiting.  Genitourinary: Negative for dysuria, frequency, hematuria and urgency.  Musculoskeletal: Negative for back pain, joint pain, myalgias and neck pain.  Skin: Negative for itching and rash.  Neurological: Negative for dizziness, tremors and weakness.  Endo/Heme/Allergies: Does not bruise/bleed easily.  Psychiatric/Behavioral: Negative for depression. The patient is not nervous/anxious and does not have insomnia.    Objective: BP 112/70   Ht 5\' 4"  (1.626 m)   Wt 220 lb (99.8 kg)   LMP 03/27/2018   BMI 37.76 kg/m   Filed Weights   04/10/18 1328  Weight: 220 lb (99.8 kg)   Body mass index is 37.76 kg/m. Physical Exam Constitutional:      General: She is not in acute distress.    Appearance: She is well-developed.  Genitourinary:     Pelvic exam was performed with patient supine.     Vagina, uterus and rectum normal.     No lesions in the vagina.     No vaginal bleeding.     No cervical motion tenderness, friability, lesion or polyp.     Uterus is mobile.     Uterus is not enlarged.     No uterine mass detected.    Uterus is midaxial.     No right or left adnexal mass present.  Right adnexa not tender.     Left adnexa not tender.  HENT:     Head: Normocephalic and atraumatic. No laceration.     Right Ear: Hearing normal.     Left Ear: Hearing normal.     Mouth/Throat:     Pharynx: Uvula midline.  Eyes:     Pupils: Pupils are equal, round, and reactive to light.  Neck:     Musculoskeletal: Normal range of motion and neck supple.     Thyroid: No thyromegaly.  Cardiovascular:     Rate and Rhythm: Normal rate and regular rhythm.     Heart sounds: No murmur. No friction rub. No gallop.   Pulmonary:     Effort: Pulmonary effort is normal. No respiratory distress.     Breath sounds: Normal breath sounds. No  wheezing.  Chest:     Breasts:        Right: No mass, skin change or tenderness.        Left: No mass, skin change or tenderness.  Abdominal:     General: Bowel sounds are normal. There is no distension.     Palpations: Abdomen is soft.     Tenderness: There is no abdominal tenderness. There is no rebound.  Musculoskeletal: Normal range of motion.  Neurological:     Mental Status: She is alert and oriented to person, place, and time.     Cranial Nerves: No cranial nerve deficit.  Skin:    General: Skin is warm and dry.  Psychiatric:        Judgment: Judgment normal.  Vitals signs reviewed.   Assessment:  ANNUAL EXAM 1. Women's annual routine gynecological examination   2. Screening for cervical cancer   3. Screening for breast cancer   4. Menometrorrhagia      Screening Plan:            1.  Cervical Screening-  Pap smear done today  2. Breast screening- Exam annually and mammogram>40 planned   3. Colonoscopy every 10 years, Hemoccult testing - after age 33  4. Labs managed by PCP  5. Counseling for contraception: no method   6. Menometrorrhagia- [place] and plan intervention.  Prefers hysterectomy but will continue to provide info and education as to alternatives. Patient has abnormal uterine bleeding . She has a normal exam today, with no evidence of lesions.  Evaluation includes the following: exam, labs such as hormonal testing, and pelvic ultrasound to evaluate for any structural gynecologic abnormalities.  Patient to follow up after testing.  Treatment option for menorrhagia or menometrorrhagia discussed in great detail with the patient.  Options include hormonal therapy, IUD therapy such as Mirena, D&C, Ablation, and Hysterectomy.  The pros and cons of each option discussed with patient.    F/U  Return in about 2 weeks (around 04/24/2018) for Follow up w GYN [place].  Barnett Applebaum, MD, [facility], [facility] 04/10/2018  1:52 PM

## 2018-04-14 LAB — CYTOLOGY - PAP
Adequacy: ABSENT
DIAGNOSIS: NEGATIVE
HPV: NOT DETECTED

## 2018-04-20 ENCOUNTER — Encounter: Payer: Self-pay | Admitting: Obstetrics & Gynecology

## 2018-04-20 ENCOUNTER — Ambulatory Visit (INDEPENDENT_AMBULATORY_CARE_PROVIDER_SITE_OTHER): Payer: Managed Care, Other (non HMO) | Admitting: Obstetrics & Gynecology

## 2018-04-20 ENCOUNTER — Ambulatory Visit (INDEPENDENT_AMBULATORY_CARE_PROVIDER_SITE_OTHER): Payer: Managed Care, Other (non HMO)

## 2018-04-20 VITALS — BP 130/80 | Ht 65.0 in | Wt 242.0 lb

## 2018-04-20 DIAGNOSIS — N921 Excessive and frequent menstruation with irregular cycle: Secondary | ICD-10-CM

## 2018-04-20 NOTE — Progress Notes (Signed)
HPI: Pt has heavy periods that require large pads and depends to sleep in due to amount of flow. The patient has complaints today of HEAVY CYCLES sometimes TWO PER MONTH, some cramping and pain with them.  FH FIBROIDS  Ultrasound demonstrates no masses seen, no fibroids  PMHx: She  has a past medical history of Fluid retention and Morbid obesity ([place]). Also,  has a past surgical history that includes Cesarean section., family history includes Arthritis in her father and mother; Breast cancer in her mother and paternal grandmother; Cancer in her paternal grandmother; Diabetes in her mother; Hepatitis C in her paternal grandfather; Hypertension in her father; Thyroid disease in her mother.,  reports that she has never smoked. She has never used smokeless tobacco. She reports current alcohol use. She reports that she does not use drugs.  She has a current medication list which includes the following prescription(s): fluticasone. Also, is allergic to sulfa antibiotics and amoxicillin.  Review of Systems  Constitutional: Negative for chills, fever and malaise/fatigue.  HENT: Negative for congestion, sinus pain and sore throat.   Eyes: Negative for blurred vision and pain.  Respiratory: Negative for cough and wheezing.   Cardiovascular: Negative for chest pain and leg swelling.  Gastrointestinal: Negative for abdominal pain, constipation, diarrhea, heartburn, nausea and vomiting.  Genitourinary: Negative for dysuria, frequency, hematuria and urgency.  Musculoskeletal: Negative for back pain, joint pain, myalgias and neck pain.  Skin: Negative for itching and rash.  Neurological: Negative for dizziness, tremors and weakness.  Endo/Heme/Allergies: Does not bruise/bleed easily.  Psychiatric/Behavioral: Negative for depression. The patient is not nervous/anxious and does not have insomnia.    Objective: BP 130/80   Ht 5\' 5"  (1.651 m)   Wt 242 lb (109.8 kg)   LMP 03/27/2018   BMI 40.27 kg/m    Physical examination Constitutional NAD, Conversant  Skin No rashes, lesions or ulceration.   Extremities: Moves all appropriately.  Normal ROM for age. No lymphadenopathy.  Neuro: Grossly intact  Psych: Oriented to PPT.  Normal mood. Normal affect.   US Pelvis Transvanginal Non-ob (tv Only)  Result Date: 04/20/2018 [name] DOB: [birth year] MRN: [id number] ULTRASOUND REPORT Location: [facility] Date of Service: 04/20/2018 Indications: Menorrhagia Findings: The uterus is anteverted and measures 9.5 x 4.8 x 4.3cm. Echo texture is homogenous without evidence of focal masses. The Endometrium measures 8.0 mm. Right Ovary measures 2.8 x 1.9 x 2.1cm. It is normal in appearance. Left Ovary measures 2.3 x 2.3 x 1.9cm with a collapsed cyst seen measuring 1.6cm. Survey of the adnexa demonstrates no adnexal masses. There is no free fluid in the cul de sac. Impression: 1. Normal gyn ultrasound. Recommendations: 1.Clinical correlation with the patient's History and Physical Exam. Vita Barley, RDMS RVT Review of ULTRASOUND.    I have personally reviewed images and report of recent ultrasound done at [facility].    Plan of management to be discussed with patient. Barnett Applebaum, MD, [facility], [facility] 04/20/2018  2:12 PM   Assessment:  Menometrorrhagia Plan- All options discussed for management of menometrorrhagia.  Prefers no hormones, and did not do well w IUD in past.  Ablation preferred over hysterectomy, to schedule.  Info provided.  Patient was told that it is normal to have menstrual bleeding after an endometrial ablation, only about 80% of patients become amenorrheic, 10% of patients have normal or light periods, and 10% of patients have no change in their bleeding pattern and may need  further intervention.  She was told she will observe her periods for a few months after her ablation to see what her periods will be like; it is recommended to wait until  at least three months after the procedure before making conclusions about how periods are going to be like after an ablation.  Barnett Applebaum, MD, [facility], [facility] 04/20/2018  2:29 PM

## 2018-04-20 NOTE — Patient Instructions (Signed)
Endometrial Ablation Plan for FEB 7  Endometrial ablation is a procedure that destroys the thin inner layer of the lining of the uterus (endometrium). This procedure may be done:  To stop heavy periods.  To stop bleeding that is causing anemia.  To control irregular bleeding.  To treat bleeding caused by small tumors (fibroids) in the endometrium. This procedure is often an alternative to major surgery, such as removal of the uterus and cervix (hysterectomy). As a result of this procedure:  You may not be able to have children. However, if you are premenopausal (you have not gone through menopause): ? You may still have a small chance of getting pregnant. ? You will need to use a reliable method of birth control after the procedure to prevent pregnancy.  You may stop having a menstrual period, or you may have only a small amount of bleeding during your period. Menstruation may return several years after the procedure. Tell a health care provider about:  Any allergies you have.  All medicines you are taking, including vitamins, herbs, eye drops, creams, and over-the-counter medicines.  Any problems you or family members have had with the use of anesthetic medicines.  Any blood disorders you have.  Any surgeries you have had.  Any medical conditions you have. What are the risks? Generally, this is a safe procedure. However, problems may occur, including:  A hole (perforation) in the uterus or bowel.  Infection of the uterus, bladder, or vagina.  Bleeding.  Damage to other structures or organs.  An air bubble in the lung (air embolus).  Problems with pregnancy after the procedure.  Failure of the procedure.  Decreased ability to diagnose cancer in the endometrium. What happens before the procedure?  You will have tests of your endometrium to make sure there are no pre-cancerous cells or cancer cells present.  You may have an ultrasound of the uterus.  You may be  given medicines to thin the endometrium.  Ask your health care provider about: ? Changing or stopping your regular medicines. This is especially important if you take diabetes medicines or blood thinners. ? Taking medicines such as aspirin and ibuprofen. These medicines can thin your blood. Do not take these medicines before your procedure if your doctor tells you not to.  Plan to have someone take you home from the hospital or clinic. What happens during the procedure?   You will lie on an exam table with your feet and legs supported as in a pelvic exam.  To lower your risk of infection: ? Your health care team will wash or sanitize their hands and put on germ-free (sterile) gloves. ? Your genital area will be washed with soap.  An IV tube will be inserted into one of your veins.  You will be given a medicine to help you relax (sedative).  A surgical instrument with a light and camera (resectoscope) will be inserted into your vagina and moved into your uterus. This allows your surgeon to see inside your uterus.  Endometrial tissue will be removed using one of the following methods: ? Radiofrequency. This method uses a radiofrequency-alternating electric current to remove the endometrium. ? Cryotherapy. This method uses extreme cold to freeze the endometrium. ? Heated-free liquid. This method uses a heated saltwater (saline) solution to remove the endometrium. ? Microwave. This method uses high-energy microwaves to heat up the endometrium and remove it. ? Thermal balloon. This method involves inserting a catheter with a balloon tip into the uterus. The  balloon tip is filled with heated fluid to remove the endometrium. The procedure may vary among health care providers and hospitals. What happens after the procedure?  Your blood pressure, heart rate, breathing rate, and blood oxygen level will be monitored until the medicines you were given have worn off.  As tissue healing occurs,  you may notice vaginal bleeding for 4-6 weeks after the procedure. You may also experience: ? Cramps. ? Thin, watery vaginal discharge that is light pink or brown in color. ? A need to urinate more frequently than usual. ? Nausea.  Do not drive for 24 hours if you were given a sedative.  Do not have sex or insert anything into your vagina until your health care provider approves. Summary  Endometrial ablation is done to treat the many causes of heavy menstrual bleeding.  The procedure may be done only after medications have been tried to control the bleeding.  Plan to have someone take you home from the hospital or clinic. This information is not intended to replace advice given to you by your health care provider. Make sure you discuss any questions you have with your health care provider. Document Released: 01/26/2004 Document Revised: 09/02/2017 Document Reviewed: 04/04/2016 Elsevier Interactive Patient Education  2019 [employer].

## 2018-04-21 ENCOUNTER — Telehealth: Payer: Self-pay | Admitting: Obstetrics & Gynecology

## 2018-04-21 NOTE — Telephone Encounter (Signed)
Lmtrc

## 2018-04-21 NOTE — Telephone Encounter (Signed)
-----   Message from Gae Dry, MD sent at 04/20/2018  2:26 PM EST ----- Regarding: IN OFFICE ABLATION Prior Auth needed for patient decision making  Surgery Booking Request Patient Full Name:  Joy Mason  MRN: [id number]  DOB: [birth year]  Surgeon: Hoyt Koch, MD  Requested Surgery Date and Time: 05/08/2018 Primary Diagnosis AND Code: Menometrorrhagia Secondary Diagnosis and Code: none Surgical Procedure: IN OFFICE HYSTEROSCOPY w ENDOMETRIAL ABLATION L&D Notification: No Admission Status: OFFICE Length of Surgery: 30 Special Case Needs: MINERVA H&P: yes that week before procedure

## 2018-04-22 NOTE — Telephone Encounter (Signed)
Spoke to patient to schedule procedure and discussed [employer]. Patient decided not to schedule due to insurance coverage/costs. Patient will call back if she decides to reschedule.

## 2018-04-22 NOTE — Telephone Encounter (Signed)
Lmtrc

## 2018-04-27 ENCOUNTER — Ambulatory Visit: Payer: Managed Care, Other (non HMO) | Admitting: Obstetrics & Gynecology

## 2018-04-27 ENCOUNTER — Ambulatory Visit: Payer: Managed Care, Other (non HMO)

## 2018-05-01 ENCOUNTER — Encounter: Payer: Managed Care, Other (non HMO) | Admitting: Obstetrics & Gynecology

## 2018-05-08 ENCOUNTER — Ambulatory Visit: Payer: Managed Care, Other (non HMO) | Admitting: Obstetrics & Gynecology

## 2019-04-13 ENCOUNTER — Other Ambulatory Visit: Payer: Self-pay | Admitting: Obstetrics & Gynecology

## 2019-04-13 DIAGNOSIS — Z1231 Encounter for screening mammogram for malignant neoplasm of breast: Secondary | ICD-10-CM

## 2019-05-14 ENCOUNTER — Ambulatory Visit
Admission: RE | Admit: 2019-05-14 | Discharge: 2019-05-14 | Disposition: A | Discharge status: Home / Self Care | Payer: Managed Care, Other (non HMO) | Source: Ambulatory Visit | Attending: Obstetrics & Gynecology | Admitting: Obstetrics & Gynecology

## 2019-05-14 DIAGNOSIS — Z1231 Encounter for screening mammogram for malignant neoplasm of breast: Secondary | ICD-10-CM | POA: Diagnosis present

## 2019-05-17 ENCOUNTER — Other Ambulatory Visit: Payer: Self-pay | Admitting: Obstetrics & Gynecology

## 2019-05-17 DIAGNOSIS — R928 Other abnormal and inconclusive findings on diagnostic imaging of breast: Secondary | ICD-10-CM

## 2019-05-28 ENCOUNTER — Ambulatory Visit
Admission: RE | Admit: 2019-05-28 | Discharge: 2019-05-28 | Disposition: A | Discharge status: Home / Self Care | Payer: Managed Care, Other (non HMO) | Source: Ambulatory Visit | Attending: Obstetrics & Gynecology | Admitting: Obstetrics & Gynecology

## 2019-05-28 DIAGNOSIS — R928 Other abnormal and inconclusive findings on diagnostic imaging of breast: Secondary | ICD-10-CM

## 2019-05-31 ENCOUNTER — Other Ambulatory Visit: Payer: Self-pay | Admitting: Obstetrics & Gynecology

## 2019-05-31 DIAGNOSIS — R928 Other abnormal and inconclusive findings on diagnostic imaging of breast: Secondary | ICD-10-CM

## 2019-05-31 DIAGNOSIS — R921 Mammographic calcification found on diagnostic imaging of breast: Secondary | ICD-10-CM

## 2019-06-11 ENCOUNTER — Ambulatory Visit
Admission: RE | Admit: 2019-06-11 | Discharge: 2019-06-11 | Disposition: A | Discharge status: Home / Self Care | Payer: Managed Care, Other (non HMO) | Source: Ambulatory Visit | Attending: Obstetrics & Gynecology | Admitting: Obstetrics & Gynecology

## 2019-06-11 DIAGNOSIS — R921 Mammographic calcification found on diagnostic imaging of breast: Secondary | ICD-10-CM

## 2019-06-11 DIAGNOSIS — R928 Other abnormal and inconclusive findings on diagnostic imaging of breast: Secondary | ICD-10-CM

## 2019-06-11 HISTORY — PX: BREAST BIOPSY: SHX20

## 2019-06-14 ENCOUNTER — Telehealth: Payer: Self-pay | Admitting: Obstetrics & Gynecology

## 2019-06-14 LAB — SURGICAL PATHOLOGY

## 2019-06-14 NOTE — Telephone Encounter (Signed)
Patient will call back to be schedule when she know she can.

## 2019-06-14 NOTE — Telephone Encounter (Signed)
Called and left voice mail for patient to call back to be schedule °

## 2019-06-14 NOTE — Telephone Encounter (Signed)
-----   Message from Gae Dry, MD sent at 06/14/2019 10:21 AM EDT ----- Sch Annual please

## 2019-06-14 NOTE — Progress Notes (Signed)
Sch Annual please

## 2019-06-17 ENCOUNTER — Telehealth: Payer: Self-pay

## 2019-06-17 NOTE — Telephone Encounter (Signed)
Made an appt with Dr. Hampton Abbot on Friday 06/18/2019 at 11:15.  I contacted patient to inform of appt time, patient stated that time did not work for her, she was given [facility] office number to call and make a new appt date and time.

## 2019-06-18 ENCOUNTER — Ambulatory Visit: Payer: Managed Care, Other (non HMO) | Admitting: Surgery

## 2019-06-28 ENCOUNTER — Encounter: Payer: Self-pay | Admitting: Surgery

## 2019-06-28 ENCOUNTER — Ambulatory Visit: Payer: Managed Care, Other (non HMO) | Admitting: Surgery

## 2019-06-28 ENCOUNTER — Other Ambulatory Visit: Payer: Self-pay

## 2019-06-28 VITALS — BP 100/72 | HR 90 | Temp 97.7°F | Ht 64.0 in | Wt 220.0 lb

## 2019-06-28 DIAGNOSIS — D242 Benign neoplasm of left breast: Secondary | ICD-10-CM | POA: Diagnosis not present

## 2019-06-28 NOTE — Patient Instructions (Addendum)
Dr.Piscoya discussed with patient to the surgical treatment option at today's visit.   Our surgery scheduler Marzetta Board will contact you within the next 24-48hrs. During that call, she will contact you discuss the preparation prior to surgery, she will also discuss the different dates and times for surgery. Please have the BLUE sheet available when she contacts you. If you have any questions or concerns, please do not hesitate with giving our office a call.   Patient will give our office a call once she has discussed with her job about a day and time to move forward with surgery.  Lumpectomy  A lumpectomy, sometimes called a partial mastectomy, is surgery to remove a cancerous tumor or mass (the lump) from a breast. It is a form of breast-conserving or breast-preservation surgery. This means that the cancerous tissue is removed but the breast remains intact. During a lumpectomy, the portion of the breast that contains the tumor is removed. Some normal tissue around the lump may be taken out to make sure that all of the tumor has been removed. Lymph nodes under your arm may also be removed and tested to find out if the cancer has spread. Lymph nodes are part of the body's disease-fighting system (immune system) and are usually the first place where breast cancer spreads. Tell a health care provider about:  Any allergies you have.  All medicines you are taking, including vitamins, herbs, eye drops, creams, and over-the-counter medicines.  Any problems you or family members have had with anesthetic medicines.  Any blood disorders you have.  Any surgeries you have had.  Any medical conditions you have.  Whether you are pregnant or may be pregnant. What are the risks? Generally, this is a safe procedure. However, problems may occur, including:  Bleeding.  Infection.  Allergic reaction to medicines.  Pain, swelling, weakness, or numbness in the arm on the side of your surgery.  Temporary  swelling.  Change in the shape of the breast, particularly if a large portion is removed.  Scar tissue that forms at the surgical site and feels hard to the touch.  Blood clots. What happens before the procedure? Staying hydrated Follow instructions from your health care provider about hydration, which may include:  Up to 2 hours before the procedure - you may continue to drink clear liquids, such as water, clear fruit juice, black coffee, and plain tea.  Eating and drinking restrictions Follow instructions from your health care provider about eating and drinking, which may include:  8 hours before the procedure - stop eating heavy meals or foods, such as meat, fried foods, or fatty foods.  6 hours before the procedure - stop eating light meals or foods, such as toast or cereal.  6 hours before the procedure - stop drinking milk or drinks that contain milk.  2 hours before the procedure - stop drinking clear liquids. Medicines Ask your health care provider about:  Changing or stopping your regular medicines. This is especially important if you are taking diabetes medicines or blood thinners.  Taking medicines such as aspirin and ibuprofen. These medicines can thin your blood. Do not take these medicines unless your health care provider tells you to take them.  Taking over-the-counter medicines, vitamins, herbs, and supplements. General instructions  Prior to surgery, your health care provider may do a procedure to locate and mark the tumor area in your breast (localization). This will help guide your surgeon to where the incision will be made. This may be done  with: ? Imaging, such as a mammogram, ultrasound, or MRI. ? Insertion of a small wire, clip, or seed, or an implant that will reflect a radar signal.  You may have screening tests or exams to get baseline measurements of your arm. These can be compared to measurements done after surgery to monitor for swelling (lymphedema)  that can develop after having lymph nodes removed.  Ask your health care provider: ? How your surgery site will be marked. ? What steps will be taken to help prevent infection. These may include:  Washing skin with a germ-killing soap.  Taking antibiotic medicine.  Plan to have someone take you home from the hospital or clinic.  Plan to have a responsible adult care for you for at least 24 hours after you leave the hospital or clinic. This is important. What happens during the procedure?   An IV will be inserted into one of your veins.  You will be given one or more of the following: ? A medicine to help you relax (sedative). ? A medicine to numb the area (local anesthetic). ? A medicine to make you fall asleep (general anesthetic).  Your health care provider will use a kind of electric scalpel that uses heat to reduce bleeding (electrocautery knife). A curved incision that follows the natural curve of your breast will be made. This type of incision will allow for minimal scarring and better healing.  The tumor will be removed along with some of the tissue around it. This will be sent to the lab for testing. Your health care provider may also remove lymph nodes at this time if needed.  If the tumor is close to the muscles over your chest, some muscle tissue may also be removed.  A small drain tube may be inserted into your breast area or armpit to collect fluid that may build up after surgery. This tube will be connected to a suction bulb on the outside of your body to remove the fluid.  The incision will be closed with stitches (sutures).  A bandage (dressing) may be placed over the incision. The procedure may vary among health care providers and hospitals. What happens after the procedure?  Your blood pressure, heart rate, breathing rate, and blood oxygen level will be monitored until you leave the hospital or clinic.  You will be given medicine for pain as needed.  Your IV  will be removed when you are able to eat and drink by mouth.  You will be encouraged to get up and walk as soon as you can. This is important to improve blood flow and breathing. Ask for help if you feel weak or unsteady.  You may have: ? A drain tube in place for 2-3 days to prevent a collection of blood (hematoma) from developing in the breast. You will be given instructions about caring for the drain before you go home. ? A pressure bandage applied for 1-2 days to prevent bleeding or swelling. Your pressure bandage may look like a thick piece of fabric or an elastic wrap. Ask your health care provider how to care for your bandage at home.  You may be given a tight sleeve to wear over your arm on the side of your surgery. You should wear this sleeve as told by your health care provider.  Do not drive for 24 hours if you were given a sedative during your procedure. Summary  A lumpectomy, sometimes called a partial mastectomy, is surgery to remove a cancerous tumor  or mass (the lump) from a breast.  During a lumpectomy, the portion of the breast that contains the tumor is removed. Lymph nodes under your arm may also be removed and tested to find out if the cancer has spread.  Plan to have someone take you home from the hospital or clinic.  You may have a drain tube in place for 2-3 days to prevent a collection of blood (hematoma) from developing in the breast. You will be given instructions about caring for the drain before you go home. This information is not intended to replace advice given to you by your health care provider. Make sure you discuss any questions you have with your health care provider. Document Revised: 09/21/2018 Document Reviewed: 09/21/2018 Elsevier Patient Education  [employer] [facility].

## 2019-06-28 NOTE — Progress Notes (Signed)
06/28/2019  Reason for Visit:  Left breast intraductal papilloma  Referring Provider:  Nolon Nations, MD  History of Present Illness: Joy Mason is a 44 y.o. 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  The patient had a screening mammogram on 05/14/19 which showed abnormalities in both breasts.  A diagnostic mammogram was done on 2/26 which showed a normal right breast, but indeterminate calcifications in the lateral portion of the left breast.  This area was biopsied on 3/12, and the pathology resulted in an intraductal papilloma, negative for atypia.  The patient had not felt any masses and she does not do self exams.  Denies any skin changes, nipple drainage or changes.  She reports that she had bruising post-biopsy, but now is resolved.    Of note, she has a grandmother and mother with history of breast cancer.  Past Medical History: Past Medical History:  Diagnosis Date  . Bilateral primary osteoarthritis of knee   . Fluid retention   . Morbid obesity ([place])      Past Surgical History: Past Surgical History:  Procedure Laterality Date  . BREAST BIOPSY Left 06/11/2019   Affirm bx-"Ribbon" clip-path pending  . CESAREAN SECTION      Home Medications: Prior to Admission medications   Medication Sig Start Date End Date Taking? Authorizing Provider  fluticasone (FLONASE) 50 MCG/ACT nasal spray Place 2 sprays into both nostrils daily. Patient not taking: Reported on 04/10/2018 01/30/17   Versie Starks, PA-C    Allergies: Allergies  Allergen Reactions  . Sulfa Antibiotics     Caused her to vomit per her mother happened as a child.  . Amoxicillin Rash    Social History:  reports that she has never smoked. She has never used smokeless tobacco. She reports current alcohol use. She reports that she does not use drugs.   Family History: Family History  Problem Relation Age of Onset  . Diabetes Mother   . Thyroid disease Mother   .  Arthritis Mother   . Breast cancer Mother        lumpectomy  . Hypertension Father   . Arthritis Father   . Cancer Paternal Grandmother        breast  . Breast cancer Paternal Grandmother   . Hepatitis C Paternal Grandfather     Review of Systems: Review of Systems  Constitutional: Negative for chills and fever.  HENT: Negative for hearing loss.   Respiratory: Negative for cough.   Cardiovascular: Negative for chest pain.  Gastrointestinal: Negative for abdominal pain, nausea and vomiting.  Genitourinary: Negative for dysuria.  Musculoskeletal: Negative for myalgias.  Skin: Negative for rash.  Neurological: Negative for dizziness.  Psychiatric/Behavioral: Negative for depression.    Physical Exam BP 100/72   Pulse 90   Temp 97.7 F (36.5 C) (Temporal)   Ht 5\' 4"  (1.626 m)   Wt 220 lb (99.8 kg)   SpO2 98%   BMI 37.76 kg/m  CONSTITUTIONAL: No acute distress HEENT:  Normocephalic, atraumatic, extraocular motion intact. NECK: Trachea is midline, and there is no jugular venous distension.  RESPIRATORY:  Lungs are clear, and breath sounds are equal bilaterally. Normal respiratory effort without pathologic use of accessory muscles. CARDIOVASCULAR: Heart is regular without murmurs, gallops, or rubs. BREAST:  Left breast s/p biopsy of left outer lower quadrant.  Steri strips removed with healing scar.  No ecchymosis.  No palpable masses, skin changes, or nipple changes.  No left axillary or supraclavicular lymphadenopathy.  Right breast without any palpable masses, skin changes, or nipple changes.  No right axillary or supraclavicular lymphadenopathy. GI: The abdomen is soft, obese, non-distended, non-tender.  MUSCULOSKELETAL:  Normal muscle strength and tone in all four extremities.  No peripheral edema or cyanosis. SKIN: Skin turgor is normal. There are no pathologic skin lesions.  NEUROLOGIC:  Motor and sensation is grossly normal.  Cranial nerves are grossly intact. PSYCH:   Alert and oriented to person, place and time. Affect is normal.  Laboratory Analysis: Left breast biopsy 06/11/19: DIAGNOSIS:  A. BREAST, LEFT LOWER OUTER QUADRANT; STEREOTACTIC CORE NEEDLE BIOPSY:  - BENIGN MAMMARY PARENCHYMA WITH FIBROCYSTIC CHANGES.  - FOCAL CHANGES SUGGESTIVE OF BENIGN INTRADUCTAL PAPILLOMA.  - CALCIFICATIONS ASSOCIATED WITH BENIGN MAMMARY TISSUE.  - NEGATIVE FOR ATYPICAL PROLIFERATIVE BREAST DISEASE.   Imaging: Mammogram 05/28/19: FINDINGS: Additional views are performed of the LATERAL portion of the RIGHT breast, showing no persistent asymmetry.  Magnified views are performed of calcifications in the LATERAL portion of the LEFT breast. Magnified views show a group of fine pleomorphic calcifications spanning 5 millimeters.  Mammographic images were processed with CAD.  IMPRESSION: 1. No persistent asymmetry in the RIGHT breast. 2. Indeterminate calcifications in the LATERAL portion of the LEFT breast.  RECOMMENDATION: Recommend stereotactic guided core biopsy of LEFT breast calcifications.  BI-RADS CATEGORY  4: Suspicious.  Assessment and Plan: This is a 44 y.o. female with new diagnosis of left intraductal papilloma.  --Discussed with the patient that overall the diagnosis is a benign finding.  If she had no risk factors, or if the density of her breast were lower, I would be in favor for possibly observe this area.  However, given the strong family history with her mother and grandmother having breast cancer, and the density of the breasts, I would recommend surgical excision for complete diagnosis.  This may be completely benign, but the patient agrees that she would like to know and also be cautious and excise that area. --Discussed with her the role for RFID tagged left breast lumpectomy.  Discussed the risks of bleeding, infection, injury to surrounding structures, need for further procedures depending on the final diagnosis, post-op recovery and  restrictions, and she's willing to proceed.  She will talk with her job about when would be the best timing for surgery, and then she will call us back to schedule.  She understands that she would need to get tested for COVID prior to surgery.  Face-to-face time spent with the patient and care providers was 60 minutes, with more than 50% of the time spent counseling, educating, and coordinating care of the patient.     Melvyn Neth, [facility]

## 2019-06-29 ENCOUNTER — Telehealth: Payer: Self-pay | Admitting: *Deleted

## 2019-06-29 NOTE — Telephone Encounter (Signed)
Patient called and is ready to have surgery and she is looking at April 8th to get this done, she sees Dr Hampton Abbot

## 2019-07-01 ENCOUNTER — Inpatient Hospital Stay: Admission: RE | Admit: 2019-07-01 | Payer: Managed Care, Other (non HMO) | Source: Ambulatory Visit

## 2019-07-06 ENCOUNTER — Other Ambulatory Visit: Admission: RE | Admit: 2019-07-06 | Payer: Managed Care, Other (non HMO) | Source: Ambulatory Visit

## 2019-07-08 ENCOUNTER — Ambulatory Visit: Admission: RE | Admit: 2019-07-08 | Payer: Managed Care, Other (non HMO) | Source: Home / Self Care | Admitting: Surgery

## 2019-07-08 ENCOUNTER — Encounter: Admission: RE | Payer: Self-pay | Source: Home / Self Care

## 2019-07-08 SURGERY — BREAST LUMPECTOMY WITH RADIOFREQUENCY TAG IDENTIFICATION
Anesthesia: General | Laterality: Left

## 2019-07-13 ENCOUNTER — Telehealth: Payer: Self-pay | Admitting: Surgery

## 2019-07-13 NOTE — Telephone Encounter (Signed)
Outbound call made & message left requesting a call back, as we were checking on her family & reminding her if/when she is ready to reschedule surgery w/Dr. Hampton Abbot, we are here.  Thank you

## 2019-07-28 ENCOUNTER — Telehealth: Payer: Self-pay | Admitting: Surgery

## 2019-07-28 NOTE — Telephone Encounter (Signed)
Was able to speak with the patient today.  She unfortunately lost her mom which is why she was not able to schedule surgery.  She does have a few things that she is dealing with now and wants to wait also for her daughter to get out of school.  She will call when she is ready for scheduling surgery.

## 2019-08-02 ENCOUNTER — Telehealth: Payer: Self-pay | Admitting: Surgery

## 2019-08-02 NOTE — Telephone Encounter (Signed)
Patient is calling and is asking if she can get schedule for 09/16/19 for surgery said she also had something for the [facility]. Please call patient and advise.

## 2019-08-02 NOTE — Telephone Encounter (Signed)
Outgoing call is made, left message for patient to call me. Dr. Hampton Abbot will be out of the office that week, so June 17th will not work.  Left message for alternate dates.

## 2019-08-05 ENCOUNTER — Telehealth: Payer: Self-pay | Admitting: Surgery

## 2019-08-05 ENCOUNTER — Other Ambulatory Visit: Payer: Self-pay | Admitting: Surgery

## 2019-08-05 DIAGNOSIS — Z853 Personal history of malignant neoplasm of breast: Secondary | ICD-10-CM

## 2019-08-05 NOTE — Telephone Encounter (Signed)
Pt has been advised of Pre-Admission date/time, COVID Testing date and Surgery date. Patient also informed that will need another follow up visit with  Dr. Hampton Abbot prior to surgery, she is scheduled for this 09/22/19.  Patient also advised that [facility] has been trying to get in touch with her for RFID tags to be scheduled.  This appointment with [facility]i still pending.     Surgery Date: 10/21/19 Preadmission Testing Date: 10/11/19 (phone 1p-5p) Covid Testing Date: 10/19/19 - patient advised to go to the [facility] ([place]) between 8a-1p   Patient has been made aware to call [phone], between 1-3:00pm the day before surgery, to find out what time to arrive for surgery.

## 2019-08-10 ENCOUNTER — Other Ambulatory Visit: Payer: Self-pay | Admitting: Surgery

## 2019-08-10 DIAGNOSIS — Z853 Personal history of malignant neoplasm of breast: Secondary | ICD-10-CM

## 2019-08-11 ENCOUNTER — Ambulatory Visit: Payer: Managed Care, Other (non HMO) | Admitting: Surgery

## 2019-08-13 ENCOUNTER — Ambulatory Visit: Admission: RE | Admit: 2019-08-13 | Payer: Managed Care, Other (non HMO) | Source: Ambulatory Visit

## 2019-09-15 ENCOUNTER — Telehealth: Payer: Self-pay | Admitting: *Deleted

## 2019-09-15 NOTE — Telephone Encounter (Signed)
Patient is scheduled to have surgery on 10/21/19 by Dr Hampton Abbot Breast lumpectomy with Radiofrequency Tag Identification and she is very concerned about being put under anesthesia and was wanting to know if this is something she can do in office

## 2019-09-15 NOTE — Telephone Encounter (Signed)
Pt called back in regards to her upcoming sx. Pt states she does not want to be put under general anesthesia, just wants to have it done under local. Pt advised that this is typically done in the OR for various reasons. Pt does have a follow up appt with Dr Hampton Abbot 6/23 and wants to make him aware of this. Advised patient I will let him know of this and this topic can be spoken about during that visit. Pt understands Dr Hampton Abbot is on vacation and states this can wait until he returns. Pt has no further concerns.

## 2019-09-15 NOTE — Telephone Encounter (Signed)
Call to patient. Message left for her to call back.

## 2019-09-22 ENCOUNTER — Ambulatory Visit (INDEPENDENT_AMBULATORY_CARE_PROVIDER_SITE_OTHER): Payer: Managed Care, Other (non HMO) | Admitting: Surgery

## 2019-09-22 ENCOUNTER — Encounter: Payer: Self-pay | Admitting: Surgery

## 2019-09-22 ENCOUNTER — Other Ambulatory Visit: Payer: Self-pay

## 2019-09-22 VITALS — BP 107/75 | HR 73 | Temp 97.7°F | Ht 64.0 in | Wt 246.8 lb

## 2019-09-22 DIAGNOSIS — D242 Benign neoplasm of left breast: Secondary | ICD-10-CM | POA: Diagnosis not present

## 2019-09-22 NOTE — Progress Notes (Signed)
  09/22/2019  History of Present Illness: Joy Mason is a 44 y.o. female presenting for H&P update for left breast surgery scheduled on 7/22.  She was seen last on 3/29 as new patient for newly found intraductal papilloma on left breast biopsy.  Her surgery was postponed due to family issues and death in the family.  She's currently scheduled for 7/22.  Denies any new issues, drainage, pain, masses, or other skin changes.  Past Medical History: Past Medical History:  Diagnosis Date  . Bilateral primary osteoarthritis of knee   . Fluid retention   . Morbid obesity ([place])      Past Surgical History: Past Surgical History:  Procedure Laterality Date  . BREAST BIOPSY Left 06/11/2019   Affirm bx-"Ribbon" clip-path pending  . CESAREAN SECTION      Home Medications: Prior to Admission medications   Not on File    Allergies: Allergies  Allergen Reactions  . Sulfa Antibiotics Nausea And Vomiting  . Amoxicillin Swelling and Rash    Did it involve swelling of the face/tongue/throat, SOB, or low BP? Yes Did it involve sudden or severe rash/hives, skin peeling, or any reaction on the inside of your mouth or nose? Yes Did you need to seek medical attention at a hospital or doctor's office? Yes When did it last happen?7 years If all above answers are "NO", may proceed with cephalosporin use.     Review of Systems: Review of Systems  Constitutional: Negative for chills and fever.  Respiratory: Negative for shortness of breath.   Cardiovascular: Negative for chest pain.  Gastrointestinal: Negative for abdominal pain, nausea and vomiting.  Skin: Negative for rash.    Physical Exam BP 107/75   Pulse 73   Temp 97.7 F (36.5 C) (Temporal)   Ht _0  (1.626 m)   Wt 246 lb 12.8 oz (111.9 kg)   SpO2 97%   BMI 42.36 kg/m  CONSTITUTIONAL: No acute distress HEENT:  Normocephalic, atraumatic, extraocular motion intact. RESPIRATORY:  Lungs are clear, and breath sounds are  equal bilaterally. Normal respiratory effort without pathologic use of accessory muscles. CARDIOVASCULAR: Heart is regular without murmurs, gallops, or rubs. BREAST:  Exam deferred. NEUROLOGIC:  Motor and sensation is grossly normal.  Cranial nerves are grossly intact. PSYCH:  Alert and oriented to person, place and time. Affect is normal.   Assessment and Plan: This is a 44 y.o. female with left breast intraductal papilloma.  --Patient is scheduled for 7/22.  She has requested that she does not undergo general anesthesia for this case, and I think we can comply with that request and do MAC.  She will have her RF tag placed on 6/25.  Discussed with her the risks of bleeding, infection, injury to surrounding structures, possible need for further procedures depending on final pathology, and she's willing to proceed.  She understands she will need COVID testing prior to surgery.  Face-to-face time spent with the patient and care providers was 25 minutes, with more than 50% of the time spent counseling, educating, and coordinating care of the patient.     Melvyn Neth, [facility]

## 2019-09-22 NOTE — H&P (View-Only) (Signed)
  09/22/2019  History of Present Illness: Joy Mason is a 43 y.o. female presenting for H&P update for left breast surgery scheduled on 7/22.  She was seen last on 3/29 as new patient for newly found intraductal papilloma on left breast biopsy.  Her surgery was postponed due to family issues and death in the family.  She's currently scheduled for 7/22.  Denies any new issues, drainage, pain, masses, or other skin changes.  Past Medical History: Past Medical History:  Diagnosis Date  . Bilateral primary osteoarthritis of knee   . Fluid retention   . Morbid obesity ([facility])      Past Surgical History: Past Surgical History:  Procedure Laterality Date  . BREAST BIOPSY Left 06/11/2019   Affirm bx-"Ribbon" clip-path pending  . CESAREAN SECTION      Home Medications: Prior to Admission medications   Not on File    Allergies: Allergies  Allergen Reactions  . Sulfa Antibiotics Nausea And Vomiting  . Amoxicillin Swelling and Rash    Did it involve swelling of the face/tongue/throat, SOB, or low BP? Yes Did it involve sudden or severe rash/hives, skin peeling, or any reaction on the inside of your mouth or nose? Yes Did you need to seek medical attention at a hospital or doctor's office? Yes When did it last happen?7 years If all above answers are "NO", may proceed with cephalosporin use.     Review of Systems: Review of Systems  Constitutional: Negative for chills and fever.  Respiratory: Negative for shortness of breath.   Cardiovascular: Negative for chest pain.  Gastrointestinal: Negative for abdominal pain, nausea and vomiting.  Skin: Negative for rash.    Physical Exam BP 107/75   Pulse 73   Temp 97.7 F (36.5 C) (Temporal)   Ht 5' 4" (1.626 m)   Wt 246 lb 12.8 oz (111.9 kg)   SpO2 97%   BMI 42.36 kg/m  CONSTITUTIONAL: No acute distress HEENT:  Normocephalic, atraumatic, extraocular motion intact. RESPIRATORY:  Lungs are clear, and breath sounds are  equal bilaterally. Normal respiratory effort without pathologic use of accessory muscles. CARDIOVASCULAR: Heart is regular without murmurs, gallops, or rubs. BREAST:  Exam deferred. NEUROLOGIC:  Motor and sensation is grossly normal.  Cranial nerves are grossly intact. PSYCH:  Alert and oriented to person, place and time. Affect is normal.   Assessment and Plan: This is a 43 y.o. female with left breast intraductal papilloma.  --Patient is scheduled for 7/22.  She has requested that she does not undergo general anesthesia for this case, and I think we can comply with that request and do MAC.  She will have her RF tag placed on 6/25.  Discussed with her the risks of bleeding, infection, injury to surrounding structures, possible need for further procedures depending on final pathology, and she's willing to proceed.  She understands she will need COVID testing prior to surgery.  Face-to-face time spent with the patient and care providers was 25 minutes, with more than 50% of the time spent counseling, educating, and coordinating care of the patient.     Donni Oglesby Luis Chrisha Vogel, MD [facility]    

## 2019-09-24 ENCOUNTER — Ambulatory Visit
Admission: RE | Admit: 2019-09-24 | Discharge: 2019-09-24 | Disposition: A | Discharge status: Home / Self Care | Payer: Managed Care, Other (non HMO) | Source: Ambulatory Visit | Attending: Surgery | Admitting: Surgery

## 2019-09-24 DIAGNOSIS — Z853 Personal history of malignant neoplasm of breast: Secondary | ICD-10-CM | POA: Insufficient documentation

## 2019-10-11 ENCOUNTER — Other Ambulatory Visit: Payer: Self-pay

## 2019-10-11 ENCOUNTER — Encounter
Admission: RE | Admit: 2019-10-11 | Discharge: 2019-10-11 | Disposition: A | Discharge status: Home / Self Care | Payer: Managed Care, Other (non HMO) | Source: Ambulatory Visit | Attending: Surgery | Admitting: Surgery

## 2019-10-11 NOTE — Patient Instructions (Signed)
COVID TESTING Date: October 19, 2019 TUESDAY Testing site:  [facility] [address] Thru Hours:  1:61 am - 1:00 pm Once you are tested, you are asked to stay quarantined (avoiding public places) until after your surgery.   Your procedure is scheduled on: October 21, 2019 THURSDAY Report to Day Surgery on the 2nd floor of the [facility]. To find out your arrival time, please call [phone] between 1PM - 3PM on: October 20, 2019 [facility]  REMEMBER: Instructions that are not followed completely may result in serious medical risk, up to and including death; or upon the discretion of your surgeon and anesthesiologist your surgery may need to be rescheduled.  Do not eat food after midnight the night before surgery.  No gum chewing, lozengers or hard candies.  You may however, drink CLEAR liquids up to 2 hours before you are scheduled to arrive for your surgery. Do not drink anything within 2 hours of your scheduled arrival time.  Clear liquids include: - water  - apple juice without pulp - gatorade (not RED) - black coffee or tea (Do NOT add milk or creamers to the coffee or tea) Do NOT drink anything that is not on this list.  Type 1 and Type 2 diabetics should only drink water.  TAKE THESE MEDICATIONS THE MORNING OF SURGERY WITH A SIP OF WATER: NONE  Follow recommendations from Cardiologist, Pulmonologist or PCP regarding stopping Aspirin, Coumadin, Plavix, Eliquis, Pradaxa, or Pletal.  Stop Anti-inflammatories (NSAIDS) such as Advil, Aleve, Ibuprofen, Motrin, Naproxen, Naprosyn and Aspirin based products such as Excedrin, Goodys Powder, BC Powder. (May take Tylenol or Acetaminophen if needed.)  Stop ANY OVER THE COUNTER supplements until after surgery. (May continue Vitamin D, Vitamin B, and multivitamin.)  No Alcohol for 24 hours before or after surgery.  No Smoking including e-cigarettes for 24 hours prior to surgery.  No chewable tobacco  products for at least 6 hours prior to surgery.  No nicotine patches on the day of surgery.  Do not use any "recreational" drugs for at least a week prior to your surgery.  Please be advised that the combination of cocaine and anesthesia may have negative outcomes, up to and including death. If you test positive for cocaine, your surgery will be cancelled.  On the morning of surgery brush your teeth with toothpaste and water, you may rinse your mouth with mouthwash if you wish. Do not swallow any toothpaste or mouthwash.  Do not wear jewelry, make-up, hairpins, clips or nail polish.  Do not wear lotions, powders, or perfumes OR DEODORANT   Do not shave 48 hours prior to surgery.   Contact lenses, hearing aids and dentures may not be worn into surgery.  Do not bring valuables to the hospital. [facility] is not responsible for any missing/lost belongings or valuables.   Use CHG Soap as directed on instruction sheet.  Notify your doctor if there is any change in your medical condition (cold, fever, infection).  Wear comfortable clothing (specific to your surgery type) to the hospital.  Plan for stool softeners for home use; pain medications have a tendency to cause constipation. You can also help prevent constipation by eating foods high in fiber such as fruits and vegetables and drinking plenty of fluids as your diet allows.  After surgery, you can help prevent lung complications by doing breathing exercises.  Take deep breaths and cough every 1-2 hours. Your doctor may order a device called an Chiropodist  to help you take deep breaths. When coughing or sneezing, hold a pillow firmly against your incision with both hands. This is called "splinting." Doing this helps protect your incision. It also decreases belly discomfort.  If you are being admitted to the hospital overnight, leave your suitcase in the car. After surgery it may be brought to your room.  If you are being  discharged the day of surgery, you will not be allowed to drive home. You will need a responsible adult (18 years or older) to drive you home and stay with you that night.   Please call the [place] Dept. at [phone] if you have any questions about these instructions.  Visitation Policy:  Patients undergoing a surgery or procedure may have one family member or support person with them as long as that person is not COVID-19 positive or experiencing its symptoms.  That person may remain in the waiting area during the procedure.  Children under 74 years of age may have both parents or legal guardians with them during their procedure.  Inpatient Visitation Update:   Two designated support people may visit a patient during visiting hours 7 am to 8 pm. It must be the same two designated people for the duration of the patient stay. The visitors may come and go during the day, and there is no switching out to have different visitors. A mask must be worn at all times, including in the patient room.  Children under 33 years of age:  a total of 4 designated visitors for the child's entire stay are allowed. Only 2 in the room at a time and only one staying overnight at a time. The overnight guest can now rotate during the child's hospital stay.  As a reminder, masks are still required for all [facility] team members, patients and visitors in all [facility] facilities.   Systemwide, no visitors 17 or younger.

## 2019-10-19 ENCOUNTER — Other Ambulatory Visit: Payer: Self-pay

## 2019-10-19 ENCOUNTER — Other Ambulatory Visit
Admission: RE | Admit: 2019-10-19 | Discharge: 2019-10-19 | Disposition: A | Discharge status: Home / Self Care | Payer: Managed Care, Other (non HMO) | Source: Ambulatory Visit | Attending: Surgery | Admitting: Surgery

## 2019-10-19 DIAGNOSIS — Z20822 Contact with and (suspected) exposure to covid-19: Secondary | ICD-10-CM | POA: Diagnosis not present

## 2019-10-19 DIAGNOSIS — Z01812 Encounter for preprocedural laboratory examination: Secondary | ICD-10-CM | POA: Diagnosis not present

## 2019-10-20 LAB — SARS CORONAVIRUS 2 (TAT 6-24 HRS): SARS Coronavirus 2: NEGATIVE

## 2019-10-21 ENCOUNTER — Ambulatory Visit
Admission: RE | Admit: 2019-10-21 | Discharge: 2019-10-21 | Disposition: A | Discharge status: Home / Self Care | Payer: Managed Care, Other (non HMO) | Source: Ambulatory Visit | Attending: Surgery | Admitting: Surgery

## 2019-10-21 ENCOUNTER — Other Ambulatory Visit: Payer: Self-pay

## 2019-10-21 ENCOUNTER — Ambulatory Visit
Admission: RE | Admit: 2019-10-21 | Discharge: 2019-10-21 | Disposition: A | Discharge status: Home / Self Care | Payer: Managed Care, Other (non HMO) | Attending: Surgery | Admitting: Surgery

## 2019-10-21 ENCOUNTER — Ambulatory Visit: Payer: Managed Care, Other (non HMO) | Admitting: Anesthesiology

## 2019-10-21 ENCOUNTER — Encounter: Payer: Self-pay | Admitting: Surgery

## 2019-10-21 ENCOUNTER — Encounter
Admission: RE | Disposition: A | Discharge status: Home / Self Care | Payer: Self-pay | Source: Home / Self Care | Attending: Surgery

## 2019-10-21 DIAGNOSIS — Z6841 Body Mass Index (BMI) 40.0 and over, adult: Secondary | ICD-10-CM | POA: Insufficient documentation

## 2019-10-21 DIAGNOSIS — N6489 Other specified disorders of breast: Secondary | ICD-10-CM | POA: Insufficient documentation

## 2019-10-21 DIAGNOSIS — D242 Benign neoplasm of left breast: Secondary | ICD-10-CM | POA: Diagnosis not present

## 2019-10-21 DIAGNOSIS — Z853 Personal history of malignant neoplasm of breast: Secondary | ICD-10-CM

## 2019-10-21 HISTORY — PX: BREAST EXCISIONAL BIOPSY: SUR124

## 2019-10-21 HISTORY — PX: BREAST LUMPECTOMY WITH RADIOFREQUENCY TAG IDENTIFICATION: SHX6884

## 2019-10-21 LAB — POCT PREGNANCY, URINE: Preg Test, Ur: NEGATIVE

## 2019-10-21 SURGERY — BREAST LUMPECTOMY WITH RADIOFREQUENCY TAG IDENTIFICATION
Anesthesia: General | Laterality: Left

## 2019-10-21 MED ORDER — CHLORHEXIDINE GLUCONATE 0.12 % MT SOLN
OROMUCOSAL | Status: AC
  Administered 2019-10-21: 15 mL via OROMUCOSAL
  Filled 2019-10-21: qty 15

## 2019-10-21 MED ORDER — DROPERIDOL 2.5 MG/ML IJ SOLN
0.6250 mg | Freq: Once | INTRAMUSCULAR | Status: DC | PRN
  Filled 2019-10-21: qty 2

## 2019-10-21 MED ORDER — MIDAZOLAM HCL 2 MG/2ML IJ SOLN
INTRAMUSCULAR | Status: DC | PRN
  Administered 2019-10-21: 2 mg via INTRAVENOUS

## 2019-10-21 MED ORDER — CLINDAMYCIN PHOSPHATE 900 MG/50ML IV SOLN
900.0000 mg | INTRAVENOUS | Status: AC
  Administered 2019-10-21: 900 mg via INTRAVENOUS

## 2019-10-21 MED ORDER — PROMETHAZINE HCL 25 MG/ML IJ SOLN: 6.2500 mg | INTRAMUSCULAR | Status: DC | PRN

## 2019-10-21 MED ORDER — ACETAMINOPHEN 500 MG PO TABS
ORAL_TABLET | ORAL | Status: AC
  Administered 2019-10-21: 1000 mg via ORAL
  Filled 2019-10-21: qty 2

## 2019-10-21 MED ORDER — PROPOFOL 10 MG/ML IV BOLUS
INTRAVENOUS | Status: DC | PRN
  Administered 2019-10-21: 100 mg via INTRAVENOUS

## 2019-10-21 MED ORDER — FENTANYL CITRATE (PF) 100 MCG/2ML IJ SOLN: 25.0000 ug | INTRAMUSCULAR | Status: DC | PRN

## 2019-10-21 MED ORDER — BUPIVACAINE-EPINEPHRINE 0.5% -1:200000 IJ SOLN
INTRAMUSCULAR | Status: DC | PRN
  Administered 2019-10-21: 30 mL

## 2019-10-21 MED ORDER — FAMOTIDINE 20 MG PO TABS: 20.0000 mg | ORAL_TABLET | Freq: Once | ORAL | Status: AC

## 2019-10-21 MED ORDER — FENTANYL CITRATE (PF) 100 MCG/2ML IJ SOLN
INTRAMUSCULAR | Status: AC
  Filled 2019-10-21: qty 2

## 2019-10-21 MED ORDER — GABAPENTIN 300 MG PO CAPS
ORAL_CAPSULE | ORAL | Status: AC
  Administered 2019-10-21: 300 mg via ORAL
  Filled 2019-10-21: qty 1

## 2019-10-21 MED ORDER — CLINDAMYCIN PHOSPHATE 900 MG/50ML IV SOLN
INTRAVENOUS | Status: AC
  Filled 2019-10-21: qty 50

## 2019-10-21 MED ORDER — FENTANYL CITRATE (PF) 100 MCG/2ML IJ SOLN
INTRAMUSCULAR | Status: DC | PRN
  Administered 2019-10-21 (×2): 50 ug via INTRAVENOUS
  Administered 2019-10-21: 100 ug via INTRAVENOUS
  Administered 2019-10-21: 50 ug via INTRAVENOUS

## 2019-10-21 MED ORDER — PROPOFOL 10 MG/ML IV BOLUS
INTRAVENOUS | Status: AC
  Filled 2019-10-21: qty 20

## 2019-10-21 MED ORDER — IBUPROFEN 600 MG PO TABS: 600.0000 mg | ORAL_TABLET | Freq: Three times a day (TID) | ORAL | 1 refills | Status: DC | PRN

## 2019-10-21 MED ORDER — ONDANSETRON HCL 4 MG/2ML IJ SOLN
INTRAMUSCULAR | Status: DC | PRN
  Administered 2019-10-21: 4 mg via INTRAVENOUS

## 2019-10-21 MED ORDER — LIDOCAINE HCL (PF) 2 % IJ SOLN
INTRAMUSCULAR | Status: AC
  Filled 2019-10-21: qty 5

## 2019-10-21 MED ORDER — HYDROCODONE-ACETAMINOPHEN 7.5-325 MG PO TABS: 1.0000 | ORAL_TABLET | Freq: Once | ORAL | Status: DC | PRN

## 2019-10-21 MED ORDER — LIDOCAINE HCL (CARDIAC) PF 100 MG/5ML IV SOSY
PREFILLED_SYRINGE | INTRAVENOUS | Status: DC | PRN
  Administered 2019-10-21: 100 mg via INTRAVENOUS
  Administered 2019-10-21: 50 mg via INTRAVENOUS

## 2019-10-21 MED ORDER — ACETAMINOPHEN 500 MG PO TABS: 1000.0000 mg | ORAL_TABLET | ORAL | Status: AC

## 2019-10-21 MED ORDER — MEPERIDINE HCL 50 MG/ML IJ SOLN: 6.2500 mg | INTRAMUSCULAR | Status: DC | PRN

## 2019-10-21 MED ORDER — FAMOTIDINE 20 MG PO TABS
ORAL_TABLET | ORAL | Status: AC
  Administered 2019-10-21: 20 mg via ORAL
  Filled 2019-10-21: qty 1

## 2019-10-21 MED ORDER — CHLORHEXIDINE GLUCONATE 0.12 % MT SOLN: 15.0000 mL | Freq: Once | OROMUCOSAL | Status: AC

## 2019-10-21 MED ORDER — GABAPENTIN 300 MG PO CAPS: 300.0000 mg | ORAL_CAPSULE | ORAL | Status: AC

## 2019-10-21 MED ORDER — BUPIVACAINE-EPINEPHRINE (PF) 0.5% -1:200000 IJ SOLN
INTRAMUSCULAR | Status: AC
  Filled 2019-10-21: qty 30

## 2019-10-21 MED ORDER — MIDAZOLAM HCL 2 MG/2ML IJ SOLN
INTRAMUSCULAR | Status: AC
  Filled 2019-10-21: qty 2

## 2019-10-21 MED ORDER — CHLORHEXIDINE GLUCONATE CLOTH 2 % EX PADS: 6.0000 | MEDICATED_PAD | Freq: Once | CUTANEOUS | Status: DC

## 2019-10-21 MED ORDER — OXYCODONE HCL 5 MG PO TABS: 5.0000 mg | ORAL_TABLET | ORAL | 0 refills | Status: DC | PRN

## 2019-10-21 MED ORDER — FENTANYL CITRATE (PF) 250 MCG/5ML IJ SOLN
INTRAMUSCULAR | Status: AC
  Filled 2019-10-21: qty 5

## 2019-10-21 MED ORDER — ORAL CARE MOUTH RINSE: 15.0000 mL | Freq: Once | OROMUCOSAL | Status: AC

## 2019-10-21 MED ORDER — ROCURONIUM BROMIDE 10 MG/ML (PF) SYRINGE
PREFILLED_SYRINGE | INTRAVENOUS | Status: AC
  Filled 2019-10-21: qty 10

## 2019-10-21 MED ORDER — METHYLENE BLUE 0.5 % INJ SOLN
INTRAVENOUS | Status: AC
  Filled 2019-10-21: qty 10

## 2019-10-21 MED ORDER — ACETAMINOPHEN 160 MG/5ML PO SOLN
325.0000 mg | ORAL | Status: DC | PRN
  Filled 2019-10-21: qty 20.3

## 2019-10-21 MED ORDER — ACETAMINOPHEN 325 MG PO TABS: 325.0000 mg | ORAL_TABLET | ORAL | Status: DC | PRN

## 2019-10-21 MED ORDER — LACTATED RINGERS IV SOLN: INTRAVENOUS | Status: DC

## 2019-10-21 MED ORDER — DEXAMETHASONE SODIUM PHOSPHATE 10 MG/ML IJ SOLN
INTRAMUSCULAR | Status: DC | PRN
  Administered 2019-10-21: 10 mg via INTRAVENOUS

## 2019-10-21 SURGICAL SUPPLY — 56 items
ADH SKN CLS APL DERMABOND .7 (GAUZE/BANDAGES/DRESSINGS) ×1
APL PRP STRL LF DISP 70% ISPRP (MISCELLANEOUS) ×1
APPLIER CLIP 9.375 SM OPEN (CLIP)
APR CLP SM 9.3 20 MLT OPN (CLIP)
BINDER BREAST LRG (GAUZE/BANDAGES/DRESSINGS) IMPLANT
BINDER BREAST MEDIUM (GAUZE/BANDAGES/DRESSINGS) IMPLANT
BLADE PHOTON ILLUMINATED (MISCELLANEOUS) ×2 IMPLANT
BLADE SURG 15 STRL LF DISP TIS (BLADE) ×2 IMPLANT
BLADE SURG 15 STRL SS (BLADE) ×6
CANISTER SUCT 1200ML W/VALVE (MISCELLANEOUS) ×3 IMPLANT
CHLORAPREP W/TINT 26 (MISCELLANEOUS) ×3 IMPLANT
CLIP APPLIE 9.375 SM OPEN (CLIP) IMPLANT
CNTNR SPEC 2.5X3XGRAD LEK (MISCELLANEOUS)
CONT SPEC 4OZ STER OR WHT (MISCELLANEOUS)
CONT SPEC 4OZ STRL OR WHT (MISCELLANEOUS)
CONTAINER SPEC 2.5X3XGRAD LEK (MISCELLANEOUS) ×1 IMPLANT
COVER PROBE FLX POLY STRL (MISCELLANEOUS) ×3 IMPLANT
COVER WAND RF STERILE (DRAPES) IMPLANT
DERMABOND ADVANCED (GAUZE/BANDAGES/DRESSINGS) ×2
DERMABOND ADVANCED .7 DNX12 (GAUZE/BANDAGES/DRESSINGS) ×1 IMPLANT
DEVICE DUBIN SPECIMEN MAMMOGRA (MISCELLANEOUS) ×3 IMPLANT
DRAPE LAPAROTOMY 100X77 ABD (DRAPES) ×3 IMPLANT
DRSG GAUZE FLUFF 36X18 (GAUZE/BANDAGES/DRESSINGS) ×3 IMPLANT
ELECT CAUTERY BLADE 6.4 (BLADE) ×3 IMPLANT
ELECT REM PT RETURN 9FT ADLT (ELECTROSURGICAL) ×3
ELECTRODE REM PT RTRN 9FT ADLT (ELECTROSURGICAL) ×1 IMPLANT
GLOVE SURG SYN 7.0 (GLOVE) ×9 IMPLANT
GLOVE SURG SYN 7.0 PF PI (GLOVE) ×1 IMPLANT
GLOVE SURG SYN 7.5  E (GLOVE) ×9
GLOVE SURG SYN 7.5 E (GLOVE) ×3 IMPLANT
GLOVE SURG SYN 7.5 PF PI (GLOVE) ×1 IMPLANT
GOWN STRL REUS W/ TWL LRG LVL3 (GOWN DISPOSABLE) ×2 IMPLANT
GOWN STRL REUS W/TWL LRG LVL3 (GOWN DISPOSABLE) ×9
KIT MARKER MARGIN INK (KITS) IMPLANT
KIT TURNOVER KIT A (KITS) ×3 IMPLANT
LABEL OR SOLS (LABEL) ×3 IMPLANT
MARGIN MAP 10MM (MISCELLANEOUS) ×2 IMPLANT
MARKER MARGIN CORRECT CLIP (MARKER) ×2 IMPLANT
NDL HYPO 25X1 1.5 SAFETY (NEEDLE) ×1 IMPLANT
NEEDLE HYPO 22GX1.5 SAFETY (NEEDLE) ×3 IMPLANT
NEEDLE HYPO 25X1 1.5 SAFETY (NEEDLE) ×3 IMPLANT
PACK BASIN MINOR (MISCELLANEOUS) ×3 IMPLANT
SET LOCALIZER 20 PROBE US (MISCELLANEOUS) ×3 IMPLANT
SLEVE PROBE SENORX GAMMA FIND (MISCELLANEOUS) IMPLANT
SUT ETHILON 3-0 FS-10 30 BLK (SUTURE)
SUT MNCRL 4-0 (SUTURE) ×3
SUT MNCRL 4-0 27XMFL (SUTURE) ×1
SUT SILK 3 0 SH 30 (SUTURE) ×3 IMPLANT
SUT VIC AB 3-0 SH 27 (SUTURE) ×3
SUT VIC AB 3-0 SH 27X BRD (SUTURE) ×1 IMPLANT
SUTURE EHLN 3-0 FS-10 30 BLK (SUTURE) IMPLANT
SUTURE MNCRL 4-0 27XMF (SUTURE) ×1 IMPLANT
SYR 10ML LL (SYRINGE) ×3 IMPLANT
SYR BULB IRRIG 60ML STRL (SYRINGE) ×3 IMPLANT
TAPE TRANSPORE STRL 2 31045 (GAUZE/BANDAGES/DRESSINGS) IMPLANT
WATER STERILE IRR 1000ML POUR (IV SOLUTION) ×3 IMPLANT

## 2019-10-21 NOTE — Op Note (Signed)
  Procedure Date:  10/21/2019  Pre-operative Diagnosis:  Left breast intraductal papilloma  Post-operative Diagnosis:  Left breast intraductal papilloma  Procedure:  Left breast radiofrequency tagged lumpectomy  Surgeon:  Melvyn Neth, MD  Assistant:  Kerry Dory, PA-S  Anesthesia:  MAC with LMA  Estimated Blood Loss:  2 ml  Specimens:  Left breast mass  Complications:  None  Indications for Procedure:  This is a 44 y.o. female who presents with a left breast intraductal papilloma.  The risks of bleeding, infection, injury to surrounding structures, hematoma, seroma, open wound, cosmetic deformity, and the need for further surgery were all discussed with the patient and was willing to proceed.  Prior to this procedure, the patient had undergone radiofrequency tag placement at the breast center.  Description of Procedure: The patient was correctly identified in the preoperative area and brought into the operating room.  The patient was placed supine with VTE prophylaxis in place.  Appropriate time-outs were performed.  Anesthesia was induced and the patient was sedated with LMA.  Appropriate antibiotics were infused.  The RF localizer was used to confirm and identify the placement of the RF tag in the left outer lower quadrant.  This was marked with permanent marker.  The left breast and axilla were prepped and draped in usual sterile fashion.  A 4 cm incision was made encompassing the point marked previously.  The localizer pen and cautery were used to perform a partial mastectomy with adequate margins.  The specimen was removed and marked with the Magic Margin and Correct Clips.  This was then placed in a container and imaged in the OR, confirming that the specimen contained for the RF tag and the biopsy marker clip.  The cavity was irrigated and hemostasis was assured with electrocautery.  Local anesthetic was infiltrated into the skin and subcutaneous tissue of the cavity.  The  wound was then closed in two layers with 3-0 Vicryl and 4-0 Monocryl and sealed with DermaBond.  The patient was emerged from anesthesia and extubated and brought to the recovery room for further management.  The patient tolerated the procedure well and all counts were correct at the end of the case.   Melvyn Neth, MD

## 2019-10-21 NOTE — Anesthesia Preprocedure Evaluation (Addendum)
Anesthesia Evaluation  Patient identified by MRN, date of birth, ID band Patient awake    Reviewed: Allergy & Precautions, H&P , NPO status , Patient's Chart, lab work & pertinent test results, reviewed documented beta blocker date and time   Airway Mallampati: II  TM Distance: >3 FB Neck ROM: full    Dental  (+) Teeth Intact   Pulmonary neg pulmonary ROS,    Pulmonary exam normal        Cardiovascular negative cardio ROS Normal cardiovascular exam     Neuro/Psych negative neurological ROS     GI/Hepatic negative GI ROS, Neg liver ROS, neg GERD  ,  Endo/Other  negative endocrine ROS  Renal/GU negative Renal ROS     Musculoskeletal  (+) Arthritis , Osteoarthritis,    Abdominal   Peds  Hematology negative hematology ROS (+)   Anesthesia Other Findings Past Medical History: No date: Bilateral primary osteoarthritis of knee No date: Fluid retention No date: Morbid obesity ([place]) Past Surgical History: 06/11/2019: BREAST BIOPSY; Left     Comment:  Affirm bx-"Ribbon" papilloma  No date: CESAREAN SECTION BMI    Body Mass Index: 40.34 kg/m     Reproductive/Obstetrics                           Anesthesia Physical Anesthesia Plan  ASA: II  Anesthesia Plan: General   Post-op Pain Management:    Induction: Intravenous  PONV Risk Score and Plan: Ondansetron, Treatment may vary due to age or medical condition and Midazolam  Airway Management Planned: LMA  Additional Equipment:   Intra-op Plan:   Post-operative Plan: Extubation in OR  Informed Consent: I have reviewed the patients History and Physical, chart, labs and discussed the procedure including the risks, benefits and alternatives for the proposed anesthesia with the patient or authorized representative who has indicated his/her understanding and acceptance.     Dental Advisory Given  Plan Discussed with: CRNA  Anesthesia  Plan Comments:        Anesthesia Quick Evaluation

## 2019-10-21 NOTE — Discharge Instructions (Signed)

## 2019-10-21 NOTE — Anesthesia Procedure Notes (Signed)
Procedure Name: LMA Insertion Date/Time: 10/21/2019 8:43 AM Performed by: Genevie Ann, CRNA Pre-anesthesia Checklist: Patient identified, Emergency Drugs available, Suction available, Patient being monitored and Timeout performed Patient Re-evaluated:Patient Re-evaluated prior to induction Oxygen Delivery Method: Circle system utilized Preoxygenation: Pre-oxygenation with 100% oxygen LMA Size: 4.5 Number of attempts: 1

## 2019-10-21 NOTE — Interval H&P Note (Signed)
History and Physical Interval Note:  10/21/2019 8:22 AM  Joy Mason  has presented today for surgery, with the diagnosis of left breast intraductal papilloma.  The various methods of treatment have been discussed with the patient and family. After consideration of risks, benefits and other options for treatment, the patient has consented to  Procedure(s): BREAST LUMPECTOMY WITH RADIOFREQUENCY TAG IDENTIFICATION (Left) as a surgical intervention.  The patient's history has been reviewed, patient examined, no change in status, stable for surgery.  I have reviewed the patient's chart and labs.  Questions were answered to the patient's satisfaction.     Ionia Schey

## 2019-10-21 NOTE — Transfer of Care (Signed)
Immediate Anesthesia Transfer of Care Note  Patient: Joy Mason  Procedure(s) Performed: BREAST LUMPECTOMY WITH RADIOFREQUENCY TAG IDENTIFICATION (Left )  Patient Location: PACU  Anesthesia Type:General  Level of Consciousness: awake, alert  and oriented  Airway & Oxygen Therapy: Patient Spontanous Breathing and Patient connected to nasal cannula oxygen  Post-op Assessment: Report given to RN and Post -op Vital signs reviewed and stable  Post vital signs: Reviewed and stable  Last Vitals:  Vitals Value Taken Time  BP 118/73 10/21/19 1007  Temp    Pulse 68 10/21/19 1010  Resp 13 10/21/19 1010  SpO2 100 % 10/21/19 1010  Vitals shown include unvalidated device data.  Last Pain:  Vitals:   10/21/19 0737  TempSrc: Temporal  PainSc: 0-No pain      Patients Stated Pain Goal: 0 (46/00/29 8473)  Complications: No complications documented.

## 2019-10-21 NOTE — Anesthesia Postprocedure Evaluation (Signed)
Anesthesia Post Note  Patient: Joy Mason  Procedure(s) Performed: BREAST LUMPECTOMY WITH RADIOFREQUENCY TAG IDENTIFICATION (Left )  Patient location during evaluation: PACU Anesthesia Type: General Level of consciousness: awake and alert Pain management: pain level controlled Vital Signs Assessment: post-procedure vital signs reviewed and stable Respiratory status: spontaneous breathing, nonlabored ventilation and respiratory function stable Cardiovascular status: blood pressure returned to baseline and stable Postop Assessment: no apparent nausea or vomiting Anesthetic complications: no   No complications documented.   Last Vitals:  Vitals:   10/21/19 1055 10/21/19 1106  BP:  121/73  Pulse: 70 71  Resp: 23 18  Temp:  (!) 36.2 C  SpO2: 95% 99%    Last Pain:  Vitals:   10/21/19 1106  TempSrc: Temporal  PainSc: 0-No pain                 Alphonsus Sias

## 2019-10-21 NOTE — Anesthesia Procedure Notes (Signed)
Procedure Name: LMA Insertion Date/Time: 10/21/2019 8:43 AM Performed by: Genevie Ann, CRNA Pre-anesthesia Checklist: Patient identified, Emergency Drugs available, Suction available, Patient being monitored and Timeout performed Patient Re-evaluated:Patient Re-evaluated prior to induction Oxygen Delivery Method: Circle system utilized Preoxygenation: Pre-oxygenation with 100% oxygen Induction Type: IV induction LMA Size: 4.5 Number of attempts: 1 ETT to lip (cm): at lips.

## 2019-10-22 ENCOUNTER — Encounter: Payer: Self-pay | Admitting: Surgery

## 2019-10-26 LAB — SURGICAL PATHOLOGY

## 2019-11-03 ENCOUNTER — Other Ambulatory Visit: Payer: Self-pay

## 2019-11-03 ENCOUNTER — Encounter: Payer: Self-pay | Admitting: Surgery

## 2019-11-03 ENCOUNTER — Ambulatory Visit (INDEPENDENT_AMBULATORY_CARE_PROVIDER_SITE_OTHER): Payer: Self-pay | Admitting: Surgery

## 2019-11-03 VITALS — BP 125/86 | HR 79 | Temp 98.1°F | Ht 64.0 in | Wt 246.0 lb

## 2019-11-03 DIAGNOSIS — Z09 Encounter for follow-up examination after completed treatment for conditions other than malignant neoplasm: Secondary | ICD-10-CM

## 2019-11-03 DIAGNOSIS — D242 Benign neoplasm of left breast: Secondary | ICD-10-CM

## 2019-11-03 NOTE — Progress Notes (Signed)
11/03/2019  HPI: Joy Mason is a 44 y.o. female s/p left breast RF tag localized lumpectomy on 7/22 for intraductal papilloma.  She presents today for follow up.  She has been doing very well and denies any current pain, swelling, or drainage.  She wore the breast binder for a week and then switched to a sports bra because it was too tight for her.    Vital signs: BP 125/86   Pulse 79   Temp 98.1 F (36.7 C)   Ht 5\' 4"  (1.626 m)   Wt 246 lb (111.6 kg)   LMP 10/04/2019   SpO2 98%   BMI 42.23 kg/m    Physical Exam: Constitutional: No acute distress Breast:  Left breast lower outer quadrant incision is healing well, without any erythema or drainage.  There's some firmness deep to the incision consistent with scar tissue forming.    Assessment/Plan: This is a 44 y.o. female s/p left breast lumpectomy  --Reviewed pathology with patient.  Benign findings, no evidence of malignancy.  No further papilloma found, so must have been removed during biopsy.   --She may switch back to her regular bra, but discussed that if she notes any heaviness or swelling, to change back to the sports bra. --Follow up next February when she's due for her yearly mammogram.   Melvyn Neth, MD [facility]

## 2019-11-03 NOTE — Patient Instructions (Addendum)
Follow up in February after you annual screening mammogram. We will schedule this for you and schedule your follow up appointment.   You may wear a regular bra. If the are feels heavy or full you may use the sports bra and may need one at night too.  Continue to gently increase activity and lifting with the left arm.

## 2020-04-07 ENCOUNTER — Other Ambulatory Visit: Payer: Self-pay

## 2020-04-07 DIAGNOSIS — Z1231 Encounter for screening mammogram for malignant neoplasm of breast: Secondary | ICD-10-CM

## 2020-04-13 ENCOUNTER — Encounter: Payer: Self-pay | Admitting: Physician Assistant

## 2020-04-13 ENCOUNTER — Telehealth: Payer: Managed Care, Other (non HMO) | Admitting: Physician Assistant

## 2020-04-13 DIAGNOSIS — J069 Acute upper respiratory infection, unspecified: Secondary | ICD-10-CM

## 2020-04-13 NOTE — Progress Notes (Signed)
   Subjective:    Patient ID: Joy Mason, female    DOB: 11-16-1975, 45 y.o.   MRN: [id number]  HPI  TELEPHONE VISIT  45 yo F employee of [employer] in with questions/concerns- she is at work at this time, Requests telephone consult-  Reports a one week history of sniffles, nasal congestion,non productive cough, Scratchy throat, no change taste/smell, average appetite, post nasal drip Has noted mild fatigue, no fever. She is NOT vaccinated, Initially states " had a Covid test", but it is later revealed done a week ago -negative-  no additional testing Wears mask and shield in her work- not required to have vaccines  Actually feels like getting better every day Doesn't like to take any medications- has seasonal allergies but defers intervention "Doesn't want antibiotics-just to discuss symptoms" Boyfriend has been urging her to get vaccinated  Daughter of the home has had similar symptoms-also not vaccinated- Covid Neg x 1 Elderly Father of the home has had similar symptoms as well; he HAS been vaccinated  As reported by patient- his symptoms have dragged on she reports, but relates it to his older age. Unsure if he has been CV tested with symptoms  PCP [facility] : Wayland Denis PA-C  Review of Systems As noted above    Objective:   Physical Exam Per patient report above    Assessment & Plan:  Recommend that patient have repeat testing given continued symptoms and public exposure as well as lack of vaccine protection Report results to PCP and to our office for additional recommendations- Discussed usual colds and viruses and presentation as they continue to be present-  treat symptoms as she usually would with OTCs, warm tea and honey, cough or cold interventions as preferred. She uses Mucinex and is encouraged to use DM or CF formulation for cough control. Cool mist vaporizer to the bedroom;steamy showers and increased  hydration. Encourage her to stay home if fever presents or increased malaise develops She presented question of vaccination and was encouraged to strongly consider-  And perhaps to have her daughter go along as well. F/U requested.

## 2020-05-12 ENCOUNTER — Other Ambulatory Visit: Payer: Self-pay

## 2020-05-12 ENCOUNTER — Ambulatory Visit: Payer: Managed Care, Other (non HMO) | Admitting: Surgery

## 2020-05-12 ENCOUNTER — Emergency Department: Payer: Managed Care, Other (non HMO)

## 2020-05-12 ENCOUNTER — Emergency Department
Admission: EM | Admit: 2020-05-12 | Discharge: 2020-05-12 | Disposition: A | Discharge status: Home / Self Care | Payer: Managed Care, Other (non HMO) | Attending: Emergency Medicine | Admitting: Emergency Medicine

## 2020-05-12 DIAGNOSIS — Y92410 Unspecified street and highway as the place of occurrence of the external cause: Secondary | ICD-10-CM | POA: Diagnosis not present

## 2020-05-12 DIAGNOSIS — M542 Cervicalgia: Secondary | ICD-10-CM | POA: Insufficient documentation

## 2020-05-12 DIAGNOSIS — S50812A Abrasion of left forearm, initial encounter: Secondary | ICD-10-CM | POA: Insufficient documentation

## 2020-05-12 DIAGNOSIS — S060X1A Concussion with loss of consciousness of 30 minutes or less, initial encounter: Secondary | ICD-10-CM | POA: Diagnosis not present

## 2020-05-12 DIAGNOSIS — S0990XA Unspecified injury of head, initial encounter: Secondary | ICD-10-CM | POA: Diagnosis present

## 2020-05-12 MED ORDER — KETOROLAC TROMETHAMINE 60 MG/2ML IM SOLN
30.0000 mg | Freq: Once | INTRAMUSCULAR | Status: AC
  Administered 2020-05-12: 30 mg via INTRAMUSCULAR
  Filled 2020-05-12: qty 2

## 2020-05-12 MED ORDER — BACITRACIN-NEOMYCIN-POLYMYXIN 400-5-5000 EX OINT
TOPICAL_OINTMENT | Freq: Once | CUTANEOUS | Status: AC
  Administered 2020-05-12: 1 via TOPICAL
  Filled 2020-05-12: qty 1
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
## 2020-05-26 ENCOUNTER — Ambulatory Visit: Payer: Managed Care, Other (non HMO) | Admitting: Surgery

## 2020-07-28 ENCOUNTER — Ambulatory Visit: Payer: Managed Care, Other (non HMO) | Admitting: Physician Assistant

## 2020-07-28 VITALS — BP 120/68 | HR 68 | Temp 98.2°F | Resp 16 | Ht 66.0 in | Wt 214.0 lb

## 2020-07-28 DIAGNOSIS — Z0000 Encounter for general adult medical examination without abnormal findings: Secondary | ICD-10-CM

## 2020-07-28 DIAGNOSIS — Z008 Encounter for other general examination: Secondary | ICD-10-CM

## 2020-07-28 NOTE — Patient Instructions (Addendum)
Your vital signs are within normal limits. Keep up the good work. Continue to work on your weight. Look into water aerobics or water exercises. Being consistent with exercising and diet will be beneficial. Use a balanced diet and monitor salt and cholesterol. Someone from this office will call you with lab results from today.  Health Maintenance, Female Adopting a healthy lifestyle and getting preventive care are important in promoting health and wellness. Ask your health care provider about:  The right schedule for you to have regular tests and exams.  Things you can do on your own to prevent diseases and keep yourself healthy. What should I know about diet, weight, and exercise? Eat a healthy diet  Eat a diet that includes plenty of vegetables, fruits, low-fat dairy products, and lean protein.  Do not eat a lot of foods that are high in solid fats, added sugars, or sodium.   Maintain a healthy weight Body mass index (BMI) is used to identify weight problems. It estimates body fat based on height and weight. Your health care provider can help determine your BMI and help you achieve or maintain a healthy weight. Get regular exercise Get regular exercise. This is one of the most important things you can do for your health. Most adults should:  Exercise for at least 150 minutes each week. The exercise should increase your heart rate and make you sweat (moderate-intensity exercise).  Do strengthening exercises at least twice a week. This is in addition to the moderate-intensity exercise.  Spend less time sitting. Even light physical activity can be beneficial. Watch cholesterol and blood lipids Have your blood tested for lipids and cholesterol at 47 years of age, then have this test every 5 years. Have your cholesterol levels checked more often if:  Your lipid or cholesterol levels are high.  You are older than 47 years of age.  You are at high risk for heart disease. What should I know  about cancer screening? Depending on your health history and family history, you may need to have cancer screening at various ages. This may include screening for:  Breast cancer.  Cervical cancer.  Colorectal cancer.  Skin cancer.  Lung cancer. What should I know about heart disease, diabetes, and high blood pressure? Blood pressure and heart disease  High blood pressure causes heart disease and increases the risk of stroke. This is more likely to develop in people who have high blood pressure readings, are of African descent, or are overweight.  Have your blood pressure checked: ? Every 3-5 years if you are 58-58 years of age. ? Every year if you are 33 years old or older. Diabetes Have regular diabetes screenings. This checks your fasting blood sugar level. Have the screening done:  Once every three years after age 56 if you are at a normal weight and have a low risk for diabetes.  More often and at a younger age if you are overweight or have a high risk for diabetes. What should I know about preventing infection? Hepatitis B If you have a higher risk for hepatitis B, you should be screened for this virus. Talk with your health care provider to find out if you are at risk for hepatitis B infection. Hepatitis C Testing is recommended for:  Everyone born from [birth year] through 1967.  Anyone with known risk factors for hepatitis C. Sexually transmitted infections (STIs)  Get screened for STIs, including gonorrhea and chlamydia, if: ? You are sexually active and are younger than  47 years of age. ? You are older than 47 years of age and your health care provider tells you that you are at risk for this type of infection. ? Your sexual activity has changed since you were last screened, and you are at increased risk for chlamydia or gonorrhea. Ask your health care provider if you are at risk.  Ask your health care provider about whether you are at high risk for HIV. Your health care  provider may recommend a prescription medicine to help prevent HIV infection. If you choose to take medicine to prevent HIV, you should first get tested for HIV. You should then be tested every 3 months for as long as you are taking the medicine. Pregnancy  If you are about to stop having your period (premenopausal) and you may become pregnant, seek counseling before you get pregnant.  Take 400 to 800 micrograms (mcg) of folic acid every day if you become pregnant.  Ask for birth control (contraception) if you want to prevent pregnancy. Osteoporosis and menopause Osteoporosis is a disease in which the bones lose minerals and strength with aging. This can result in bone fractures. If you are 7 years old or older, or if you are at risk for osteoporosis and fractures, ask your health care provider if you should:  Be screened for bone loss.  Take a calcium or vitamin D supplement to lower your risk of fractures.  Be given hormone replacement therapy (HRT) to treat symptoms of menopause. Follow these instructions at home: Lifestyle  Do not use any products that contain nicotine or tobacco, such as cigarettes, e-cigarettes, and chewing tobacco. If you need help quitting, ask your health care provider.  Do not use street drugs.  Do not share needles.  Ask your health care provider for help if you need support or information about quitting drugs. Alcohol use  Do not drink alcohol if: ? Your health care provider tells you not to drink. ? You are pregnant, may be pregnant, or are planning to become pregnant.  If you drink alcohol: ? Limit how much you use to 0-1 drink a day. ? Limit intake if you are breastfeeding.  Be aware of how much alcohol is in your drink. In the [place] one drink equals one 12 oz bottle of beer (355 mL), one 5 oz glass of wine (148 mL), or one 1 oz glass of hard liquor (44 mL). General instructions  Schedule regular health, dental, and eye exams.  Stay current  with your vaccines.  Tell your health care provider if: ? You often feel depressed. ? You have ever been abused or do not feel safe at home. Summary  Adopting a healthy lifestyle and getting preventive care are important in promoting health and wellness.  Follow your health care provider's instructions about healthy diet, exercising, and getting tested or screened for diseases.  Follow your health care provider's instructions on monitoring your cholesterol and blood pressure. This information is not intended to replace advice given to you by your health care provider. Make sure you discuss any questions you have with your health care provider. Document Revised: 03/11/2018 Document Reviewed: 03/11/2018 Elsevier Patient Education  2021 [employer].

## 2020-07-28 NOTE — Progress Notes (Signed)
   Subjective:    Patient ID: Joy Mason, female    DOB: Jan 19, 1976, 45 y.o.   MRN: [id number]  HPI  Pt is a 45 y/o female. She works with [employer]. She presents for the annual biometric exam. 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 She reports that there office has had it's share of changes and other challenges. She feels she is managing the stressors well. She has pampering dates, and exercises often. She has some knee issues, but she is finding ways to get around the pain problem.     Review of Systems  Constitutional: Negative.   HENT: Negative.   Eyes: Negative.   Respiratory: Negative.   Cardiovascular: Negative.   Gastrointestinal: Negative.   Genitourinary: Negative.   Musculoskeletal: Positive for arthralgias.  Neurological: Negative.        Objective:   Physical Exam Constitutional:      Appearance: Normal appearance.  HENT:     Head: Normocephalic.     Right Ear: Tympanic membrane and ear canal normal.     Left Ear: Tympanic membrane and ear canal normal.     Nose: Nose normal.     Mouth/Throat:     Mouth: Mucous membranes are moist.     Pharynx: Oropharynx is clear.  Eyes:     Extraocular Movements: Extraocular movements intact.     Pupils: Pupils are equal, round, and reactive to light.  Cardiovascular:     Rate and Rhythm: Normal rate.     Pulses: Normal pulses.  Pulmonary:     Effort: Pulmonary effort is normal.     Breath sounds: Normal breath sounds.  Abdominal:     General: Abdomen is flat. Bowel sounds are normal.  Musculoskeletal:        General: No swelling or deformity.     Cervical back: Normal range of motion and neck supple.     Comments: Mild crepitus of the right and left knee. No effusion. Varicose veins present from the right and left thigh to the lower legs. No ulcers.  Skin:    General: Skin is warm.  Neurological:     General: No focal  deficit present.     Mental Status: She is alert.  Psychiatric:        Mood and Affect: Mood normal.           Assessment & Plan:   Assessment:  1. Encounter for Biometric exam/screening 2. Varicose veins 3. Degenerative Joint Disease - knees  Plan: Your vital signs are within normal limits. Keep up the good work. Continue to work on your weight. Look into water aerobics or water exercises. Being consistent with exercising and diet will be beneficial. Use a balanced diet and monitor salt and cholesterol. Someone from this office will call you with lab results from today.

## 2020-07-28 NOTE — Progress Notes (Signed)
   Subjective:    Patient ID: Joy Mason, female    DOB: 03-21-1976, 45 y.o.   MRN: [id number]         Objective:          Assessment & Plan:    Assessment:

## 2020-07-29 LAB — LIPID PANEL
Chol/HDL Ratio: 2.4 ratio (ref 0.0–4.4)
Cholesterol, Total: 153 mg/dL (ref 100–199)
HDL: 65 mg/dL (ref >39)
LDL Chol Calc (NIH): 79 mg/dL (ref 0–99)
Triglycerides: 39 mg/dL (ref 0–149)
VLDL Cholesterol Cal: 9 mg/dL (ref 5–40)

## 2020-07-29 LAB — GLUCOSE, RANDOM: Glucose: 70 mg/dL (ref 65–99)
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
## 2021-12-18 ENCOUNTER — Other Ambulatory Visit: Payer: Self-pay | Admitting: Registered Nurse

## 2021-12-18 DIAGNOSIS — Z1231 Encounter for screening mammogram for malignant neoplasm of breast: Secondary | ICD-10-CM

## 2022-04-28 ENCOUNTER — Encounter: Payer: Self-pay | Admitting: Emergency Medicine

## 2022-04-28 ENCOUNTER — Ambulatory Visit
Admission: EM | Admit: 2022-04-28 | Discharge: 2022-04-28 | Disposition: A | Discharge status: Home / Self Care | Payer: Managed Care, Other (non HMO) | Attending: Family Medicine | Admitting: Family Medicine

## 2022-04-28 DIAGNOSIS — R059 Cough, unspecified: Secondary | ICD-10-CM | POA: Diagnosis present

## 2022-04-28 DIAGNOSIS — Z1152 Encounter for screening for COVID-19: Secondary | ICD-10-CM | POA: Diagnosis not present

## 2022-04-28 DIAGNOSIS — J101 Influenza due to other identified influenza virus with other respiratory manifestations: Secondary | ICD-10-CM | POA: Diagnosis not present

## 2022-04-28 DIAGNOSIS — Z20828 Contact with and (suspected) exposure to other viral communicable diseases: Secondary | ICD-10-CM | POA: Insufficient documentation

## 2022-04-28 LAB — RESP PANEL BY RT-PCR (RSV, FLU A&B, COVID)  RVPGX2
Influenza A by PCR: POSITIVE — AB
Influenza B by PCR: NEGATIVE
Resp Syncytial Virus by PCR: NEGATIVE
SARS Coronavirus 2 by RT PCR: NEGATIVE

## 2022-04-28 MED ORDER — IPRATROPIUM BROMIDE 0.06 % NA SOLN: 2.0000 | Freq: Four times a day (QID) | NASAL | 12 refills | Status: AC

## 2022-04-28 MED ORDER — CHERATUSSIN AC 100-10 MG/5ML PO SOLN: 5.0000 mL | Freq: Three times a day (TID) | ORAL | 0 refills | Status: AC | PRN

## 2022-04-28 NOTE — ED Triage Notes (Signed)
Patient c/o sinus pressure, congestion and headache, nasal congestion, and cough, and bodyaches that started Friday.  Patient unsure of fevers.

## 2022-04-28 NOTE — Discharge Instructions (Addendum)
You have influenza A.  If your were prescribed medication, stop by the pharmacy to pick them up.   You can take Tylenol and/or Ibuprofen as needed for fever reduction and pain relief.    For cough: honey 1/2 to 1 teaspoon (you can dilute the honey in water or another fluid).  You can also use guaifenesin and dextromethorphan for cough. You can use a humidifier for chest congestion and cough.  If you don't have a humidifier, you can sit in the bathroom with the hot shower running.      For sore throat: try warm salt water gargles, Mucinex sore throat cough drops or cepacol lozenges, throat spray, warm tea or water with lemon/honey, popsicles or ice, or OTC cold relief medicine for throat discomfort. You can also purchase chloraseptic spray at the pharmacy or dollar store.   For congestion: take a daily anti-histamine like Zyrtec, Claritin, and a oral decongestant, such as pseudoephedrine.  You can also use Flonase 1-2 sprays in each nostril daily. Afrin is also a good option, if you do not have high blood pressure.    It is important to stay hydrated: drink plenty of fluids (water, gatorade/powerade/pedialyte, juices, or teas) to keep your throat moisturized and help further relieve irritation/discomfort.    Return or go to the Emergency Department if symptoms worsen or do not improve in the next few days

## 2022-04-28 NOTE — ED Provider Notes (Signed)
[facility]    CSN: [id number] Arrival date & time: 04/28/22  1059      History   Chief Complaint Chief Complaint  Patient presents with   Cough   Sinus Problem    HPI Joy Mason is a 47 y.o. female.   HPI   [name] presents for congestion, body aches and rhinorrhea since Friday. She has been glued to her bed for a couple days. She has throat pain and coughing with sneezing. She felt warm but didn't take her temperature. She doesn't have an appetite and has not been able to sleep. She started wheezing last night.  Took lots of OTC medications without relief. Drank some warm tea with honey. Her daughter was sick last week with the flu and her husband is coughing. No history of asthma or smoking.     Past Medical History:  Diagnosis Date   Bilateral primary osteoarthritis of knee    Fluid retention    Morbid obesity [facility])     Patient Active Problem List   Diagnosis Date Noted   Intraductal papilloma of breast, left    Menometrorrhagia 04/10/2018   FH: breast cancer in first degree relative 06/03/2014    Past Surgical History:  Procedure Laterality Date   BREAST BIOPSY Left 06/11/2019   Affirm bx-"Ribbon" papilloma    BREAST LUMPECTOMY WITH RADIOFREQUENCY TAG IDENTIFICATION Left 06/14/1759   Procedure: BREAST LUMPECTOMY WITH RADIOFREQUENCY TAG IDENTIFICATION;  Surgeon: Olean Ree, MD;  Location: [facility] ORS;  Service: General;  Laterality: Left;   CESAREAN SECTION      OB History     Gravida  3   Para  3   Term  3   Preterm  0   AB  0   Living  3      SAB  0   IAB  0   Ectopic  0   Multiple  0   Live Births               Home Medications    Prior to Admission medications   Not on File    Family History Family History  Problem Relation Age of Onset   Diabetes Mother    Thyroid disease Mother    Arthritis Mother    Breast cancer Mother        lumpectomy   Hypertension Father    Arthritis Father    Cancer  Paternal Grandmother        breast   Breast cancer Paternal Grandmother    Hepatitis C Paternal Grandfather     Social History Social History   Tobacco Use   Smoking status: Never   Smokeless tobacco: Never  Vaping Use   Vaping Use: Never used  Substance Use Topics   Alcohol use: Yes    Alcohol/week: 0.0 standard drinks of alcohol    Comment: social   Drug use: No     Allergies   Sulfa antibiotics and Amoxicillin   Review of Systems Review of Systems: negative unless otherwise stated in HPI.      Physical Exam Triage Vital Signs ED Triage Vitals  Enc Vitals Group     BP 04/28/22 1222 (!) 129/91     Pulse Rate 04/28/22 1222 73     Resp 04/28/22 1222 14     Temp 04/28/22 1222 97.8 F (36.6 C)     Temp Source 04/28/22 1222 Oral     SpO2 04/28/22 1222 99 %     Weight  04/28/22 1220 214 lb 1.1 oz (97.1 kg)     Height 04/28/22 1220 '5\' 6"'$  (1.676 m)     Head Circumference --      Peak Flow --      Pain Score 04/28/22 1220 7     Pain Loc --      Pain Edu? --      Excl. in [place]? --    No data found.  Updated Vital Signs BP (!) 129/91 (BP Location: Left Arm)   Pulse 73   Temp 97.8 F (36.6 C) (Oral)   Resp 14   Ht '5\' 6"'$  (1.676 m)   Wt 97.1 kg   SpO2 99%   BMI 34.55 kg/m   Visual Acuity Right Eye Distance:   Left Eye Distance:   Bilateral Distance:    Right Eye Near:   Left Eye Near:    Bilateral Near:     Physical Exam GEN:     alert, non-toxic appearing female in no distress ***   HENT:  mucus membranes moist, oropharyngeal ***without lesions or ***exudate, no*** tonsillar hypertrophy, *** mild oropharyngeal erythema , *** moderate erythematous edematous turbinates, ***clear nasal discharge, ***bilateral TM normal EYES:   pupils equal and reactive, ***no scleral injection or discharge NECK:  normal ROM, no ***lymphadenopathy, ***no meningismus   RESP:  no increased work of breathing, ***clear to auscultation bilaterally CVS:   regular rate ***and  rhythm Skin:   warm and dry, no rash on visible skin***    UC Treatments / Results  Labs (all labs ordered are listed, but only abnormal results are displayed) Labs Reviewed  RESP PANEL BY RT-PCR (RSV, FLU A&B, COVID)  RVPGX2 - Abnormal; Notable for the following components:      Result Value   Influenza A by PCR POSITIVE (*)    All other components within normal limits    EKG   Radiology No results found.  Procedures Procedures (including critical care time)  Medications Ordered in UC Medications - No data to display  Initial Impression / Assessment and Plan / UC Course  I have reviewed the triage vital signs and the nursing notes.  Pertinent labs & imaging results that were available during my care of the patient were reviewed by me and considered in my medical decision making (see chart for details).       Pt is a 47 y.o. female who presents for *** days of respiratory symptoms. Zamyiah is ***afebrile here without recent antipyretics. Satting well on room air. Overall pt is ***non-toxic appearing, well hydrated, without respiratory distress. Pulmonary exam ***is unremarkable.  COVID and influenza testing obtained ***and was negative. ***Pt to quarantine until COVID test results or longer if positive.  I will call patient with test results, if positive. History consistent with ***viral respiratory illness. Discussed symptomatic treatment.  Explained lack of efficacy of antibiotics in viral disease.  Typical duration of symptoms discussed.   Return and ED precautions given and voiced understanding. Discussed MDM, treatment plan and plan for follow-up with patient/guardian*** who agrees with plan.     Final Clinical Impressions(s) / UC Diagnoses   Final diagnoses:  Influenza A   Discharge Instructions   None    ED Prescriptions   None    PDMP not reviewed this encounter.

## 2022-04-29 ENCOUNTER — Telehealth: Payer: Self-pay | Admitting: Emergency Medicine

## 2022-04-29 MED ORDER — BENZONATATE 100 MG PO CAPS: 100.0000 mg | ORAL_CAPSULE | Freq: Three times a day (TID) | ORAL | 0 refills | Status: AC

## 2022-04-29 NOTE — Telephone Encounter (Signed)
Per patient medication no sent at time of visit, Tessalon sent , advised to take as directed

## 2022-08-05 ENCOUNTER — Other Ambulatory Visit: Payer: Self-pay | Admitting: Physician Assistant

## 2022-08-05 DIAGNOSIS — Z1231 Encounter for screening mammogram for malignant neoplasm of breast: Secondary | ICD-10-CM

## 2022-08-28 ENCOUNTER — Ambulatory Visit
Admission: RE | Admit: 2022-08-28 | Discharge: 2022-08-28 | Disposition: A | Discharge status: Home / Self Care | Payer: Managed Care, Other (non HMO) | Source: Ambulatory Visit | Attending: Physician Assistant | Admitting: Physician Assistant

## 2022-08-28 DIAGNOSIS — Z1231 Encounter for screening mammogram for malignant neoplasm of breast: Secondary | ICD-10-CM | POA: Diagnosis present

## 2022-10-08 ENCOUNTER — Ambulatory Visit: Payer: Managed Care, Other (non HMO) | Admitting: Obstetrics & Gynecology

## 2022-10-08 ENCOUNTER — Other Ambulatory Visit (HOSPITAL_COMMUNITY)
Admission: RE | Admit: 2022-10-08 | Discharge: 2022-10-08 | Disposition: A | Discharge status: Home / Self Care | Payer: Managed Care, Other (non HMO) | Source: Ambulatory Visit | Attending: Obstetrics & Gynecology | Admitting: Obstetrics & Gynecology

## 2022-10-08 ENCOUNTER — Encounter: Payer: Self-pay | Admitting: Obstetrics & Gynecology

## 2022-10-08 VITALS — BP 129/84 | HR 89 | Ht 64.0 in | Wt 200.0 lb

## 2022-10-08 DIAGNOSIS — Z01419 Encounter for gynecological examination (general) (routine) without abnormal findings: Secondary | ICD-10-CM

## 2022-10-08 DIAGNOSIS — Z1339 Encounter for screening examination for other mental health and behavioral disorders: Secondary | ICD-10-CM | POA: Diagnosis not present

## 2022-10-08 NOTE — Progress Notes (Signed)
GYNECOLOGY ANNUAL PREVENTATIVE CARE ENCOUNTER NOTE  History:     Joy Mason is a 47 y.o. [id number] female here for a routine annual gynecologic exam and to re-establish care.  Current complaints: none.  Had heavier bleeding in the past, normal regular cycles now. No concerning vasomotor perimenopausal symptoms.   Denies abnormal vaginal bleeding, discharge, pelvic pain, problems with intercourse or other gynecologic concerns.    Gynecologic History Patient's last menstrual period was 09/30/2022 (approximate). Contraception: none Last Pap: 04/10/2018. Result was normal with negative HPV Last Mammogram: 08/29/2022.  Result was normal Last Colonoscopy: 08/30/2020.  Result was benign  Obstetric History OB History  Gravida Para Term Preterm AB Living  3 3 3  0 0 3  SAB IAB Ectopic Multiple Live Births  0 0 0 0      # Outcome Date GA Lbr Len/2nd Weight Sex Delivery Anes PTL Lv  3 Term           2 Term           1 Term             Past Medical History:  Diagnosis Date   Bilateral primary osteoarthritis of knee    Fluid retention    Morbid obesity (HCC)     Past Surgical History:  Procedure Laterality Date   BREAST BIOPSY Left 06/11/2019   Affirm bx-"Ribbon" papilloma    BREAST EXCISIONAL BIOPSY Left 10/21/2019   BREAST LUMPECTOMY WITH RADIOFREQUENCY TAG IDENTIFICATION Left 10/21/2019   Procedure: BREAST LUMPECTOMY WITH RADIOFREQUENCY TAG IDENTIFICATION;  Surgeon: Henrene Dodge, MD;  Location: [facility] ORS;  Service: General;  Laterality: Left;   CESAREAN SECTION      Current Outpatient Medications on File Prior to Visit  Medication Sig Dispense Refill   benzonatate (TESSALON) 100 MG capsule Take 1 capsule (100 mg total) by mouth every 8 (eight) hours. (Patient not taking: Reported on 10/08/2022) 21 capsule 0   guaiFENesin-codeine (CHERATUSSIN AC) 100-10 MG/5ML syrup Take 5 mLs by mouth 3 (three) times daily as needed for cough. (Patient not taking: Reported on 10/08/2022) 120  mL 0   ipratropium (ATROVENT) 0.06 % nasal spray Place 2 sprays into both nostrils 4 (four) times daily. (Patient not taking: Reported on 10/08/2022) 15 mL 12   No current facility-administered medications on file prior to visit.    Allergies  Allergen Reactions   Sulfa Antibiotics Nausea And Vomiting   Amoxicillin Swelling and Rash    Did it involve swelling of the face/tongue/throat, SOB, or low BP? Yes Did it involve sudden or severe rash/hives, skin peeling, or any reaction on the inside of your mouth or nose? Yes Did you need to seek medical attention at a hospital or doctor's office? Yes When did it last happen?      7 years If all above answers are "NO", may proceed with cephalosporin use.     Social History:  reports that she has never smoked. She has never used smokeless tobacco. She reports current alcohol use. She reports that she does not use drugs.  Family History  Problem Relation Age of Onset   Diabetes Mother    Thyroid disease Mother    Arthritis Mother    Breast cancer Mother        lumpectomy   Hypertension Father    Arthritis Father    Cancer Paternal Grandmother        breast   Breast cancer Paternal Grandmother    Hepatitis C Paternal Grandfather  The following portions of the patient's history were reviewed and updated as appropriate: allergies, current medications, past family history, past medical history, past social history, past surgical history and problem list.  Review of Systems Pertinent items noted in HPI and remainder of comprehensive ROS otherwise negative.  Physical Exam:  BP 129/84   Pulse 89   Ht 5\' 4"  (1.626 m)   Wt 200 lb (90.7 kg)   LMP 09/30/2022 (Approximate)   BMI 34.33 kg/m  CONSTITUTIONAL: Well-developed, well-nourished female in no acute distress.  HENT:  Normocephalic, atraumatic, External right and left ear normal.  EYES: Conjunctivae and EOM are normal. Pupils are equal, round, and reactive to light. No scleral  icterus.  NECK: Normal range of motion, supple, no masses.  Normal thyroid.  SKIN: Skin is warm and dry. No rash noted. Not diaphoretic. No erythema. No pallor. MUSCULOSKELETAL: Normal range of motion. No tenderness.  No cyanosis, clubbing, or edema. NEUROLOGIC: Alert and oriented to person, place, and time. Normal reflexes, muscle tone coordination.  PSYCHIATRIC: Normal mood and affect. Normal behavior. Normal judgment and thought content. CARDIOVASCULAR: Normal heart rate noted, regular rhythm RESPIRATORY: Clear to auscultation bilaterally. Effort and breath sounds normal, no problems with respiration noted. BREASTS: Symmetric in size. No masses, tenderness, skin changes, nipple drainage, or lymphadenopathy bilaterally. Performed in the presence of a chaperone. ABDOMEN: Soft, no distention noted.  No tenderness, rebound or guarding.  PELVIC: Normal appearing external genitalia and urethral meatus; normal appearing vaginal mucosa and cervix.  No abnormal vaginal discharge noted.  Pap smear obtained.  Normal uterine size, no other palpable masses, no uterine or adnexal tenderness.  Performed in the presence of a chaperone.   Assessment and Plan:     1. Well woman exam with routine gynecological exam - Cytology - PAP Will follow up results of pap smear and manage accordingly. Mammogram and colon cancer screening up to date. Routine preventative health maintenance measures emphasized. Please refer to After Visit Summary for other counseling recommendations.      Jaynie Collins, MD, FACOG Obstetrician & Gynecologist, [facility] [employer], [facility]

## 2022-10-11 LAB — CYTOLOGY - PAP
Comment: NEGATIVE
Diagnosis: NEGATIVE
High risk HPV: NEGATIVE

## 2023-10-22 ENCOUNTER — Ambulatory Visit

## 2023-10-22 ENCOUNTER — Ambulatory Visit (INDEPENDENT_AMBULATORY_CARE_PROVIDER_SITE_OTHER)

## 2023-10-22 ENCOUNTER — Ambulatory Visit: Admitting: Podiatry

## 2023-10-22 ENCOUNTER — Encounter: Payer: Self-pay | Admitting: Podiatry

## 2023-10-22 VITALS — Ht 64.0 in | Wt 198.0 lb

## 2023-10-22 DIAGNOSIS — M722 Plantar fascial fibromatosis: Secondary | ICD-10-CM

## 2023-10-22 NOTE — Patient Instructions (Signed)

## 2023-10-22 NOTE — Progress Notes (Signed)
 Subjective:  Patient ID: Joy Mason, female    DOB: 1975-11-23,  MRN: [id number]  Chief Complaint  Patient presents with   Foot Pain    RM 1 patient is here for bilateral foot pain, possible plantar fasciitis. Patient states intense stabbing pain in the morning located primarily of the heels of the feet. Patient has tried braces, cold therapy, tens unit and is interested in a steroid shot today.    Discussed the use of AI scribe software for clinical note transcription with the patient, who gave verbal consent to proceed.  History of Present Illness Joy Mason is a 48 year old female who presents with heel pain, primarily in the left foot.  She experiences dull and minor discomfort in the left heel, particularly underneath, without a specific pinpoint spot. The pain causes her to compensate by putting more weight on her right foot.  She recalls having plantar fasciitis many years ago, which resolved spontaneously within two weeks without treatment. She is uncertain about the cause of her current pain but speculates it might be due to stepping on a rock or twisting her foot at work. She frequently walks on concrete floors and had been wearing old shoes, which she has since replaced.  She has tried various interventions including buying new shoes, using Doctor Scholl's insoles, and wearing a brace. She has also been doing stretches, although she is unsure if she is performing them correctly. She has taken anti-inflammatories like Aleve and Motrin  only a couple of times due to her aversion to medication, as it makes her feel unwell. She reports a previous adverse reaction to meloxicam, which made her feel extremely ill.  In terms of her medical history, she mentions a reaction to a strong dose of amoxicillin and a possible allergy to sulfa drugs as informed by her mother. She has no issues with cortisone or lidocaine  injections, as she has received cortisone shots in her knees  before without problems.      Objective:    Physical Exam VASCULAR: DP and PT pulse palpable. Foot is warm and well-perfused. Capillary fill time is brisk. DERMATOLOGIC: Normal skin turgor, texture, and temperature. No open lesions, rashes, or ulcerations. NEUROLOGIC: Normal sensation to light touch and pressure. No paresthesias on examination. ORTHOPEDIC: Smooth pain-free range of motion of all examined joints. No ecchymosis or bruising. No gross deformity. Right foot with little to no tenderness. Left foot sharply tender at the plantar heel. No pain in the plantar fascia. Minimal gastrocnemius equinus.   No images are attached to the encounter.    Results RADIOLOGY Left foot radiograph: Small plantar calcaneal spur (10/22/2023) Right foot radiograph: Small posterior calcaneal spur (10/22/2023)   Assessment:   1. Plantar fasciitis, bilateral      Plan:  Patient was evaluated and treated and all questions answered.  Assessment and Plan Assessment & Plan Plantar fasciitis with heel spur, left foot Chronic plantar fasciitis with a small plantar calcaneal spur on the left foot, characterized by sharp tenderness at the plantar heel. The heel spur contributes to the severity but does not require surgical removal. Current symptoms are exacerbated by walking on concrete floors and wearing inadequate footwear. High arches are present. She recently switched to new shoes and insoles and prefers to avoid oral medications due to adverse reactions, opting for a steroid injection for relief. - Administer steroid injection to the left foot.  If not better in 4 to 6 weeks to return for second injection - Provide  a home exercise plan focusing on stretching and strengthening foot muscles. - Advise on the use of hard arch support insoles. - Encourage continued use of new supportive footwear.      Return if symptoms worsen or fail to improve.

## 2023-12-15 ENCOUNTER — Ambulatory Visit: Admitting: Podiatry

## 2024-01-13 ENCOUNTER — Ambulatory Visit: Admitting: Podiatry

## 2024-01-13 DIAGNOSIS — M722 Plantar fascial fibromatosis: Secondary | ICD-10-CM | POA: Diagnosis not present

## 2024-01-13 DIAGNOSIS — M62461 Contracture of muscle, right lower leg: Secondary | ICD-10-CM | POA: Diagnosis not present

## 2024-01-13 NOTE — Progress Notes (Signed)
 Subjective:  Patient ID: Joy Mason, female    DOB: 1976/01/30,  MRN: [id number]  Chief Complaint  Patient presents with   Foot Pain    Right heel pain x 1 week. 8-10/10 on the pain scale.     48 y.o. female presents with the above complaint.  Patient presents with right heel pain that has been on for quite some time is progressive gotten worse worse with ambulation or shoe pressure she wanted to get it evaluated she would like to discuss treatment options for this.  She states her heel is bothering him.  In the past the left side was acting up but is doing much better now   Review of Systems: Negative except as noted in the HPI. Denies N/V/F/Ch.  Past Medical History:  Diagnosis Date   Bilateral primary osteoarthritis of knee    Fluid retention    Morbid obesity (HCC)     Current Outpatient Medications:    benzonatate  (TESSALON ) 100 MG capsule, Take 1 capsule (100 mg total) by mouth every 8 (eight) hours., Disp: 21 capsule, Rfl: 0   guaiFENesin-codeine (CHERATUSSIN AC) 100-10 MG/5ML syrup, Take 5 mLs by mouth 3 (three) times daily as needed for cough., Disp: 120 mL, Rfl: 0   ipratropium (ATROVENT ) 0.06 % nasal spray, Place 2 sprays into both nostrils 4 (four) times daily., Disp: 15 mL, Rfl: 12  Social History   Tobacco Use  Smoking Status Never  Smokeless Tobacco Never    Allergies  Allergen Reactions   Sulfa Antibiotics Nausea And Vomiting   Amoxicillin Swelling and Rash    Did it involve swelling of the face/tongue/throat, SOB, or low BP? Yes Did it involve sudden or severe rash/hives, skin peeling, or any reaction on the inside of your mouth or nose? Yes Did you need to seek medical attention at a hospital or doctor's office? Yes When did it last happen?      7 years If all above answers are "NO", may proceed with cephalosporin use.    Objective:  There were no vitals filed for this visit. There is no height or weight on file to calculate  BMI. Constitutional Well developed. Well nourished.  Vascular Dorsalis pedis pulses palpable bilaterally. Posterior tibial pulses palpable bilaterally. Capillary refill normal to all digits.  No cyanosis or clubbing noted. Pedal hair growth normal.  Neurologic Normal speech. Oriented to person, place, and time. Epicritic sensation to light touch grossly present bilaterally.  Dermatologic Nails well groomed and normal in appearance. No open wounds. No skin lesions.  Orthopedic: Normal joint ROM without pain or crepitus bilaterally. No visible deformities. Tender to palpation at the calcaneal tuber right. No pain with calcaneal squeeze right. Ankle ROM diminished range of motion right. Silfverskiold Test: positive right.   Radiographs: None  Assessment:   1. Plantar fasciitis of right foot   2. Gastrocnemius equinus, right    Plan:  Patient was evaluated and treated and all questions answered.  Plantar Fasciitis, right with underlying gastrocnemius equinus - XR reviewed as above.  - Educated on icing and stretching. Instructions given.  - Injection delivered to the plantar fascia as below. - DME: Plantar fascial brace dispensed to support the medial longitudinal arch of the foot and offload pressure from the heel and prevent arch collapse during weightbearing - Pharmacologic management: None  Procedure: Injection Tendon/Ligament Location: Right plantar fascia at the glabrous junction; medial approach. Skin Prep: alcohol Injectate: 0.5 cc 0.5% marcaine  plain, 0.5 cc of 1% Lidocaine , 0.5  cc kenalog 10. Disposition: Patient tolerated procedure well. Injection site dressed with a band-aid.  No follow-ups on file.

## 2024-02-09 ENCOUNTER — Other Ambulatory Visit: Payer: Self-pay | Admitting: Physician Assistant

## 2024-02-09 DIAGNOSIS — Z1231 Encounter for screening mammogram for malignant neoplasm of breast: Secondary | ICD-10-CM

## 2024-02-10 ENCOUNTER — Ambulatory Visit: Admitting: Podiatry
